# Patient Record
Sex: Female | Born: 1990 | Race: White | Hispanic: No | Marital: Single | Smoking: Current every day smoker
Health system: Southern US, Community
[De-identification: ages and names within clinical notes are randomized; demographics above are authoritative.]

## PROBLEM LIST (undated history)

## (undated) HISTORY — PX: ROTATOR CUFF REPAIR: SHX139

## (undated) HISTORY — PX: ELBOW FRACTURE SURGERY: SHX616

---
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
## 2011-03-02 NOTE — Op Note (Signed)
Julie Landry, Julie Landry              ACCOUNT NO.:  [id number]   MEDICAL RECORD NO.:  [id number]          PATIENT TYPE:  AMB   LOCATION:  DSC                          FACILITY:  [facility]   PHYSICIAN:  Loreta Ave, M.D. DATE OF BIRTH:  Dec 16, 1990   DATE OF PROCEDURE:  05/23/2008  DATE OF DISCHARGE:                               OPERATIVE REPORT   PREOPERATIVE DIAGNOSES:  1. Traumatic dislocation right shoulder with Bankart lesion and Hill-      Sachs lesion.  2. 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   POSTOPERATIVE DIAGNOSES:  1. Traumatic dislocation right shoulder with Bankart lesion and Hill-      Sachs lesion.  2. 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  3. Global laxity, but very pronounced anterior posttraumatic      instability.  4. Anterior labrum tear.   PROCEDURES:  Right shoulder exam under anesthesia, arthroscopy, and  debridement of labrum.  Arthroscopic-assisted Bankart reconstruction  utilizing fiber length suture x3, PushLock anchor x3 for anterior  reconstruction.   SURGEON:  Loreta Ave, MD   ASSISTANT:  Eulas Post, MD and Genene Churn. Barry Dienes, [place], both present  throughout the entire case and necessary for timely completion of  procedure.   ANESTHESIA:  General.   BLOOD LOSS:  Minimal.   SPECIMENS:  None.   CULTURES:  None.   COMPLICATIONS:  None.   DRESSING:  Soft compressive with shoulder immobilizer.   PROCEDURE:  The patient was brought to the operating room, and after  adequate anesthesia had been obtained, both shoulders were examined.  She has increased mobility, posterior subluxation, inferior shoulder  sign, both shoulders which was symmetric.  On the left, not much  anterior instability, on the right, significant anterior instability  after recent traumatic event.  Placed in a beach-chair position on a  shoulder positioner and prepped and draped in usual sterile fashion.  On  the  right, I used a posterior and anterior portal.  The arthroscope was  introduced.  Shaver introduced.  Shoulder distended and inspected.  She  had a new Bankart lesion from 12 down to 6 o'clock.  She had had a  previous capsular shift which appeared to be intact with very reasonable  capsular tissue.  Complex tearing of the anterior labrum debrided.  Front of the glenoid was then roughened.  The capsular labral interface  was then mobilized, so I could not only repair the Bankart but also  refluff the inferior capsule to give me a little bit more stability.  No  new interval tear.  Biceps tendon, biceps anchor, and rotator cuff  intact.  Articular cartilage looked good, but there was a small Hill-  Sachs lesion posteriorly.  Anterior portal was opened for cannula.  I  also used an anterior superior portal.  Utilizing the Lasso device, the  sutures were placed in the capsular labral interface at the 2, 4, and 7  o'clock positions.  I used the middle suture to pull a capsule up  through the superior portal so that the inferior suture was placed well  inferior getting a very generous nice bite of suture.  Once the sutures  were placed, it was anchored down the glenoid at the 1, 3, and 5 o'clock  positions through drill holes utilizing PushLock anchor.  At completion,  nice firm repair of the Bankart lesion with a reefing of the capsule  superiorly.  Marked improvement of stability.  Reinspected  arthroscopically.  No other findings appreciated.  Instruments and fluid  removed.  Portals were all closed with nylon.  Sterile compressive  dressing applied.  Shoulder immobilizer applied.  Anesthesia reversed.  Brought to the recovery room.  Tolerated surgery well.  No  complications.      Loreta Ave, M.D.  Electronically Signed     DFM/MEDQ  D:  05/23/2008  T:  05/24/2008  Job:  [id number]

## 2011-03-05 NOTE — Op Note (Signed)
NAMELAQUESHA, HOLCOMB              ACCOUNT NO.:  [id number]   MEDICAL RECORD NO.:  [id number]          PATIENT TYPE:  AMB   LOCATION:  DSC                          FACILITY:  [facility]   PHYSICIAN:  Loreta Ave, M.D. DATE OF BIRTH:  [birth year]   DATE OF PROCEDURE:  01/06/2005  DATE OF DISCHARGE:                                 OPERATIVE REPORT   PREOPERATIVE DIAGNOSES:  Post traumatic instability with recurrent anterior  dislocation, right shoulder.  Underlying global ligamentous laxity both  shoulders.   POSTOPERATIVE DIAGNOSES:  Post traumatic instability with recurrent anterior  dislocation, right shoulder.  Underlying global ligamentous laxity both  shoulders with anterior labrum tear, Hill-Sachs lesion but no Bankart  lesion.   OPERATION PERFORMED:  Right shoulder examination under anesthesia,  arthroscopy, debridement of labrum and Hill-Sachs lesion.  Open  anteroinferior capsular shift procedure for stabilization.   SURGEON:  Loreta Ave, M.D.   ASSISTANT:  Genene Churn. Denton Meek.   ANESTHESIA:  General.   ESTIMATED BLOOD LOSS:  Minimal.   SPECIMENS:  None.   CULTURES:  None.   COMPLICATIONS:  None.   DRESSING:  Soft compressive with shoulder immobilizer.   DESCRIPTION OF PROCEDURE:  The patient was brought to the operating room and  after adequate anesthesia had been obtained, both shoulders examined.  She  has ligamentous laxity with hyperextension, both elbows, positive sulcus  sign both shoulders.  The right, however, has more anterior instability with  increased external rotation but there is global ligamentous laxity, again  more accentuated with traumatic injury on the right.  Placed in beach chair  position on a shoulder positioner and prepped and draped in the usual  sterile fashion.  Anterior and posterior portals created.  Shoulder entered  with blunt obturator, distended and inspected.  The anterior labral tear  debrided.  Capsular stretch injury  without a true Bankart lesion as the  capsular ligamentous structures still had good attachment all the way along  the front of the glenoid.  A little hiatus behind the labrum at the top was  a normal variant.  Biceps tendon, capsular ligamentous structures other than  being relaxed, were still intact.  Rotator cuff intact.  About a 1 cm  diameter Hill-Sachs lesion on the back of the humeral head debrided and  chondral loose bodies removed.  Instruments and fluid removed after  debridement of the labrum with a shaver.  Anterior incision from coracoid to  the anterior axillary pole.  Deltopectoral interval opened and the mini  Charnley retractor put in place, retracting the deltoid, the pectoralis as  well as conjoined tendon.  Front of the capsule exposed.  An interval tear  along the top of the subscapularis and front of the supraspinatus which  accentuated laxity.  Subscapularis was taken down anatomically, leaving some  of the tendon on the front of the capsule to thicken it.  Retracted.  The  capsule was then cut at the 3 o'clock position from the glenoid to the  humerus.  Cut from 12 o'clock to 6 o'clock position on the humerus.  Care  taken to protect the axillary nerve inferiorly.  Shoulder was inspected.  Wound irrigated.  Again confirmed no Bankart lesion.  I then reefed the  inferior leaflet up to the 12 o'clock and the superior leaflet down to 6  o'clock overlapping the two and then firmly reattaching them to one another  as well as to the lateral margin with numerous interrupted #2 Ethibond  sutures.  Stability assessed along with motion and felt to be much improved.  I then repaired the subscapularis anatomically and also along the top edge  closed the interval lesion between the front of the supraspinatus and the  top of the subscapularis to further snug up the shoulder.  At completion I  had good overhead motion, still had fairly complex external rotation past 60  degrees  without undue tension to the repair.  I left it with a normal laxity  of the shoulder, not over tightening this, but still markedly improved in  stability.  Wound irrigated.  Deltopectoral interval allowed to close.  Skin  and subcutaneous tissue closed with Vicryl.  Margins of the wound injected  with Marcaine.  Portals closed with nylon, injected with Marcaine as well.  Sterile compressive dressing and shoulder immobilizer applied.  Anesthesia  reversed.  Brought to recovery room.  Tolerated surgery well.  No  complications.      DFM/MEDQ  D:  01/06/2005  T:  01/06/2005  Job:  [id number]

## 2013-07-25 DIAGNOSIS — R87612 Low grade squamous intraepithelial lesion on cytologic smear of cervix (LGSIL): Secondary | ICD-10-CM | POA: Insufficient documentation

## 2019-07-28 ENCOUNTER — Emergency Department (INDEPENDENT_AMBULATORY_CARE_PROVIDER_SITE_OTHER): Payer: 59

## 2019-07-28 ENCOUNTER — Emergency Department
Admission: EM | Admit: 2019-07-28 | Discharge: 2019-07-28 | Disposition: A | Discharge status: Home / Self Care | Payer: 59 | Source: Home / Self Care | Attending: Emergency Medicine | Admitting: Emergency Medicine

## 2019-07-28 ENCOUNTER — Other Ambulatory Visit: Payer: Self-pay

## 2019-07-28 ENCOUNTER — Encounter: Payer: Self-pay | Admitting: *Deleted

## 2019-07-28 DIAGNOSIS — R079 Chest pain, unspecified: Secondary | ICD-10-CM

## 2019-07-28 DIAGNOSIS — R635 Abnormal weight gain: Secondary | ICD-10-CM | POA: Diagnosis not present

## 2019-07-28 DIAGNOSIS — R0789 Other chest pain: Secondary | ICD-10-CM

## 2019-07-28 DIAGNOSIS — R600 Localized edema: Secondary | ICD-10-CM

## 2019-07-28 LAB — POCT URINE PREGNANCY: Preg Test, Ur: NEGATIVE

## 2019-07-28 NOTE — ED Triage Notes (Signed)
Patient c/o 40lb weight gain in 6 months and swelling in both lower legs in the past 4 months. She started a birth control 1 month ago. Yesterday she developed chest tightness and felt slightly confused with a visual disturbance. Today she feels well but the chest tightness is intermittent. She has an appointment with a new PCP in 2 days, Rosemary Holms.

## 2019-07-28 NOTE — ED Provider Notes (Addendum)
Julie Landry CARE    CSN: [id number] Arrival date & time: 07/28/19  1329      History   Chief Complaint Chief Complaint  Patient presents with  . Leg Swelling  . Weight Gain  . Chest Pain    HPI Julie Landry is a 28 y.o. female.  New patient.  Presents to [facility] urgent care Saturday, 07/28/2019. She has an appointment with a new PCP Cecile Sheerer in 2 days, but patient was concerned about ruling out an acute heart or lung or blood clot event in the meantime. HPI Patient c/o 40lb weight gain in 6 months and swelling in both lower legs in the past 4 months. She started a birth control 1 month ago.-Had used an IUD before that.  She states she thinks she had a thyroid blood test done at her GYN within the past year that she thinks was normal.  Yesterday she developed anterior and right anterior chest tightness at rest, and felt slightly confused with a vague visual disturbance.  The visual disturbance lasted a couple seconds then resolved.  She is not sure if she was anxious at the time.  The chest discomfort lasted minutes or longer and then resolved on its own yesterday.  Today she feels well but again had the chest tightness this morning at rest, was pleuritic resolved and recurred a couple times, and still has some mild right anterior chest discomfort.  No radiation.  Not affected by position, but can be affected by movement somewhat.  Denies shoulder or elbow or neck pain associated with this, except she feels some mild bilateral, lateral neck tightness at times.  Other associated symptoms: No shortness of breath.  No syncope.  No focal weakness or numbness.  Denies nausea or vomiting or diarrhea.  Denies fever or chills or new rash.  She admits she is under a great deal of stress this past year.  Both parents have new major medical diagnoses.  She has changed jobs twice in the past year. Denies frank depressive symptoms or suicidal or homicidal ideation.  Patient's last  menstrual period was 06/28/2019.    History reviewed. No pertinent past medical history.  There are no active problems to display for this patient.   Past Surgical History:  Procedure Laterality Date  . ELBOW FRACTURE SURGERY    . ROTATOR CUFF REPAIR      OB History   No obstetric history on file.      Home Medications    Prior to Admission medications   Medication Sig Start Date End Date Taking? Authorizing Provider  levonorgestrel-ethinyl estradiol (SEASONALE) 0.15-0.03 MG tablet Take 1 tablet by mouth daily.   Yes [provider]    Family History Family History  Problem Relation Age of Onset  . Heart attack Mother   . Diabetes Mother   . Parkinson's disease Father   . Hyperlipidemia Father     Social History Social History   Tobacco Use  . Smoking status: Current Every Day Smoker    Packs/day: 1.00    Years: 8.00    Pack years: 8.00    Types: Cigarettes  . Smokeless tobacco: Never Used  Substance Use Topics  . Alcohol use: Yes  . Drug use: Never     Allergies   Sulfur   Review of Systems Review of Systems  All other systems reviewed and are negative.  Pertinent items noted in HPI and remainder of comprehensive ROS otherwise negative.   Physical Exam Triage Vital  Signs ED Triage Vitals [07/28/19 1451]  Enc Vitals Group     BP 117/74     Pulse Rate 67     Resp 20     Temp 98.7 F (37.1 C)     Temp Source Oral     SpO2 99 %     Weight 181 lb (82.1 kg)     Height 5\' 6"  (1.676 m)     Head Circumference      Peak Flow      Pain Score 0     Pain Loc      Pain Edu?      Excl. in GC?    No data found.  Updated Vital Signs BP 117/74 (BP Location: Right Arm)   Pulse 67   Temp 98.7 F (37.1 C) (Oral)   Resp 20   Ht 5\' 6"  (1.676 m)   Wt 82.1 kg   LMP 06/28/2019   SpO2 99%   BMI 29.21 kg/m   Visual Acuity Right Eye Distance:   Left Eye Distance:   Bilateral Distance:    Right Eye Near:  Grossly intact Left Eye  Near:   Grossly intact Bilateral Near:     Physical Exam Vitals signs and nursing note reviewed.  Constitutional:      General: She is not in acute distress.    Appearance: She is well-developed. She is not ill-appearing or diaphoretic.  HENT:     Head: Normocephalic and atraumatic.  Eyes:     Extraocular Movements: Extraocular movements intact.     Pupils: Pupils are equal, round, and reactive to light.  Neck:     Musculoskeletal: Normal range of motion and neck supple.     Thyroid: No thyromegaly.     Vascular: No hepatojugular reflux or JVD.     Trachea: No tracheal deviation.     Comments: Mild tenderness bilaterally of right and left cervical muscles but no posterior neck pain.  No C-spine tenderness or deformity. Cardiovascular:     Rate and Rhythm: Normal rate and regular rhythm.     Pulses:          Carotid pulses are 2+ on the right side and 2+ on the left side.    Heart sounds: No murmur. No friction rub. No gallop.   Pulmonary:     Effort: Pulmonary effort is normal.     Breath sounds: Normal breath sounds. No decreased breath sounds, wheezing, rhonchi or rales.  Chest:     Comments: Mild tenderness right costochondral junction. Abdominal:     General: Bowel sounds are normal. There is no abdominal bruit.     Palpations: Abdomen is soft. There is no hepatomegaly, splenomegaly or mass.     Tenderness: There is no abdominal tenderness. There is no guarding or rebound.  Musculoskeletal: Normal range of motion.     Right lower leg: She exhibits no tenderness. No edema.     Left lower leg: She exhibits no tenderness. No edema.  Lymphadenopathy:     Cervical: No cervical adenopathy.  Skin:    General: Skin is warm and dry.     Capillary Refill: Capillary refill takes less than 2 seconds.     Findings: No ecchymosis or rash.  Neurological:     General: No focal deficit present.     Mental Status: She is alert and oriented to person, place, and time.     Cranial  Nerves: No cranial nerve deficit.     Motor: No  weakness.  Psychiatric:        Mood and Affect: Mood normal.      UC Treatments / Results  3:30 PM, chest x-ray, EKG and urine pregnancy test ordered  Labs (all labs ordered are listed, but only abnormal results are displayed) Labs Reviewed  POCT URINE PREGNANCY  Urine pregnancy test negative.  Reviewed with patient.  EKG Normal sinus rhythm.  No acute abnormalities.  No STEMI.  No ST or T wave abnormalities noted.  Radiology Dg Chest 2 View  Result Date: 07/28/2019 CLINICAL DATA:  Chest tightness.  Leg edema.  Weight gain. EXAM: CHEST - 2 VIEW COMPARISON:  None. FINDINGS: Midline trachea. Normal heart size and mediastinal contours. No pleural effusion or pneumothorax. Suspicion of scarring at the medial right lung base. IMPRESSION: No acute cardiopulmonary disease. Electronically Signed   By: Abigail Miyamoto M.D.   On: 07/28/2019 15:55   Reviewed with patient EKG and chest x-ray within normal limits, no acute abnormalities  Medications Ordered in UC Medications - No data to display  Initial Impression / Assessment and Plan / UC Course  I have reviewed the triage vital signs and the nursing notes.  Pertinent labs & imaging results that were available during my care of the patient were reviewed by me and considered in my medical decision making (see chart for details).     Reviewed with patient above normal labs.  Explained there is no sign of acute cardiorespiratory abnormality and clinically no sign of DVT or suspicion of blood clot.  Pulse ox 99% room air.  Pulse 65, regular. She does not have pitting edema of her legs on exam so no evidence of venous insufficiency or fluid overload. I explained that she needs full evaluation and metabolic work-up by her PCP blood tests and other appropriate tests and refer if needed.-She voiced understanding and she will keep with PCP in 2 days. I explained the pain could be musculoskeletal.   There is been element of costochondritis that can be treated with ibuprofen and we discussed that. Further explained its possible that symptoms related to stress, but that would be a diagnosis of exclusion. Also advised to follow-up with GYN for ongoing management of contraception, and I explained it is possible this could be a GYN metabolic problem causing some of her symptoms, but I defer to PCP regarding this work-up. I also suggested she discuss with PCP ways of decreasing her cardiovascular risk, including option of low-dose aspirin as prophylaxis., given that she's on BCPs and smokes cigarettes.   I urged her to quit smoking and explained risks of not doing so.  She stated she was very satisfied with evaluation today and agrees with above plans. Precautions discussed. Red flags discussed. Questions invited and answered.-I offered AVS, she declined AVS. Patient voiced understanding and agreement.  Final Clinical Impressions(s) / UC Diagnoses   Final diagnoses:  Weight gain  Bilateral leg edema  Chest pain  Other chest pain     Discharge Instructions     Declined AVS    ED Prescriptions    None     PDMP not reviewed this encounter.   Jacqulyn Cane, MD 07/29/19 1459    Jacqulyn Cane, MD 07/29/19 [phone]

## 2019-07-28 NOTE — Discharge Instructions (Signed)
Declined AVS

## 2019-10-04 ENCOUNTER — Encounter: Payer: Self-pay | Admitting: Osteopathic Medicine

## 2019-10-04 ENCOUNTER — Ambulatory Visit (INDEPENDENT_AMBULATORY_CARE_PROVIDER_SITE_OTHER): Payer: Managed Care, Other (non HMO) | Admitting: Osteopathic Medicine

## 2019-10-04 ENCOUNTER — Other Ambulatory Visit: Payer: Self-pay

## 2019-10-04 VITALS — BP 124/80 | HR 77 | Temp 98.6°F | Ht 67.0 in | Wt 181.1 lb

## 2019-10-04 DIAGNOSIS — Z23 Encounter for immunization: Secondary | ICD-10-CM | POA: Diagnosis not present

## 2019-10-04 DIAGNOSIS — R0602 Shortness of breath: Secondary | ICD-10-CM

## 2019-10-04 DIAGNOSIS — R635 Abnormal weight gain: Secondary | ICD-10-CM

## 2019-10-04 DIAGNOSIS — R5382 Chronic fatigue, unspecified: Secondary | ICD-10-CM

## 2019-10-04 DIAGNOSIS — R0789 Other chest pain: Secondary | ICD-10-CM

## 2019-10-04 NOTE — Progress Notes (Signed)
HPI: Julie Landry is a 28 y.o. female who  has no past medical history on file.  she presents to [facility] today, 10/04/19,  for chief complaint of: New patient to establish care   Patient here to establish care.  She works as a Biochemist, clinical for [employer].   Reports over the past several months, noticing more fatigue/less motivation, occasional feeling like unable to get a deep breath/chest pressure, feeling more short of breath.  No chest pain on exertion.  No cough.  Used to be quite athletic, activity level has changed drastically since she has not been able to go to the gym through the Covid pandemic.  No history of officially diagnosed asthma or other respiratory illness though patient was on inhalers for short time in college when she was participating in athletics.  Patient is on OCP, smoking about 1 pack/day for 8 years, has also used e-cigarette.  Occasional alcohol use about 2 drinks per week.  Pap through [facility] in [place].  Consistent condom use, last STI screening reportedly done in September 2020.  LMP 09/21/2019   Mom has history of diabetes and heart attack -heart attack earlier this year, in her 62s.  Dad hashistory of depression and Parkinson's.      Past medical, surgical, social and family history reviewed:  There are no problems to display for this patient.   Past Surgical History:  Procedure Laterality Date  . ELBOW FRACTURE SURGERY    . ROTATOR CUFF REPAIR      Social History   Tobacco Use  . Smoking status: Current Every Day Smoker    Packs/day: 1.00    Years: 8.00    Pack years: 8.00    Types: Cigarettes  . Smokeless tobacco: Never Used  Substance Use Topics  . Alcohol use: Yes    Family History  Problem Relation Age of Onset  . Heart attack Mother   . Diabetes Mother   . Parkinson's disease Father   . Hyperlipidemia Father   . Depression Father      Current medication list and  allergy/intolerance information reviewed:    Current Outpatient Medications  Medication Sig Dispense Refill  . levonorgestrel-ethinyl estradiol (SEASONALE) 0.15-0.03 MG tablet Take 1 tablet by mouth daily.    . Levonorgestrel-Ethinyl Estradiol (SEASONIQUE) 0.15-0.03 &0.01 MG tablet Seasonique 0.15 mg-30 mcg (84)/10 mcg(7) tablets,3 month dose pack  Take 1 tablet every day by oral route.     No current facility-administered medications for this visit.    Allergies  Allergen Reactions  . Sulfa Antibiotics   . Sulfur Nausea And Vomiting      Review of Systems:  Constitutional:  No  fever, no chills, No recent illness, No unintentional weight changes. +significant fatigue.   HEENT: No  headache, no vision change, no hearing change, No sore throat, No  sinus pressure  Cardiac: No  chest pain, +pressure, No palpitations, No  Orthopnea  Respiratory:  +shortness of breath. No  Cough  Gastrointestinal: No  abdominal pain, No  nausea, No  vomiting,  No  blood in stool, No  diarrhea, No  constipation   Musculoskeletal: No new myalgia/arthralgia  Skin: No  Rash, No other wounds/concerning lesions  Genitourinary: No  incontinence, No  abnormal genital bleeding, No abnormal genital discharge  Hem/Onc: No  easy bruising/bleeding, No  abnormal lymph node  Endocrine: No cold intolerance,  No heat intolerance. No polyuria/polydipsia/polyphagia   Neurologic: No  weakness, No  dizziness,  No  slurred speech/focal weakness/facial droop  Psychiatric: No  concerns with depression, No  concerns with anxiety, No sleep problems, No mood problems  Exam:  BP 124/80 (BP Location: Left Arm, Patient Position: Sitting, Cuff Size: Normal)   Pulse 77   Temp 98.6 F (37 C) (Oral)   Ht 5\' 7"  (1.702 m)   Wt 181 lb 1.3 oz (82.1 kg)   BMI 28.36 kg/m   Constitutional: VS see above. General Appearance: alert, well-developed, well-nourished, NAD  Neck: No masses, trachea midline. No thyroid enlargement.  No tenderness/mass appreciated. No lymphadenopathy  Respiratory: Normal respiratory effort. no wheeze, no rhonchi, no rales  Cardiovascular: S1/S2 normal, no murmur, no rub/gallop auscultated. RRR. No lower extremity edema.  Gastrointestinal: Nontender, no masses. No hepatomegaly, no splenomegaly. No hernia appreciated. Bowel sounds normal. Rectal exam deferred.   Musculoskeletal: Gait normal. No clubbing/cyanosis of digits.   Neurological: Normal balance/coordination. No tremor. No cranial nerve deficit on limited exam. Motor and sensation intact and symmetric. Cerebellar reflexes intact.   Skin: warm, dry, intact. No rash/ulcer. No concerning nevi or subq nodules on limited exam.    Psychiatric: Normal judgment/insight. Normal mood and affect. Oriented x3.         ASSESSMENT/PLAN: The primary encounter diagnosis was Chronic fatigue. Diagnoses of Need for Tdap vaccination, Weight gain, Short of breath on exertion, and Chest pressure were also pertinent to this visit.  Seems to be most likely related to deconditioning, no cardiac/respiratory red flags though may benefit from PFTs/exercise tolerance test.  Could consider restart/trial inhaler, patient declines this for now and I think that is reasonable  Patient would like to proceed with exercise tolerance test to rule out cardiac issues.  We discussed gradual increase in physical activity to improve cardiovascular fitness/physical endurance.  She is not concerned about mental health as underlying cause of symptoms.  Reports normal lab work-up at OB/GYN few months ago, only other thing she can think of was Skyla removed and started on OCP but the symptoms as noted above predate this.  We will get GYN records  Orders Placed This Encounter  Procedures  . Tdap vaccine greater than or equal to 7yo IM  . Exercise Tolerance Test          Visit summary with medication list and pertinent instructions was printed for patient to  review. All questions at time of visit were answered - patient instructed to contact office with any additional concerns or updates. ER/RTC precautions were reviewed with the patient.     Please note: voice recognition software was used to produce this document, and typos may escape review. Please contact Dr. for any needed clarifications.     Follow-up plan: Return for RECHECK PENDING STRESS TEST RESULTS / IF WORSE OR CHANGE.

## 2020-01-29 ENCOUNTER — Ambulatory Visit (INDEPENDENT_AMBULATORY_CARE_PROVIDER_SITE_OTHER): Payer: 59 | Admitting: Medical-Surgical

## 2020-01-29 ENCOUNTER — Encounter: Payer: Self-pay | Admitting: Medical-Surgical

## 2020-01-29 ENCOUNTER — Other Ambulatory Visit: Payer: Self-pay

## 2020-01-29 VITALS — BP 107/71 | HR 71 | Temp 98.2°F | Ht 67.0 in | Wt 169.8 lb

## 2020-01-29 DIAGNOSIS — R319 Hematuria, unspecified: Secondary | ICD-10-CM

## 2020-01-29 DIAGNOSIS — R300 Dysuria: Secondary | ICD-10-CM | POA: Diagnosis not present

## 2020-01-29 LAB — POCT URINALYSIS DIP (CLINITEK)
Bilirubin, UA: NEGATIVE
Glucose, UA: NEGATIVE mg/dL
Ketones, POC UA: NEGATIVE mg/dL
Leukocytes, UA: NEGATIVE
Nitrite, UA: NEGATIVE
POC PROTEIN,UA: NEGATIVE
Spec Grav, UA: 1.025 (ref 1.010–1.025)
Urobilinogen, UA: 0.2 E.U./dL
pH, UA: 6 (ref 5.0–8.0)

## 2020-01-29 MED ORDER — AMOXICILLIN-POT CLAVULANATE 875-125 MG PO TABS: 1.0000 | ORAL_TABLET | Freq: Two times a day (BID) | ORAL | 0 refills | Status: AC

## 2020-01-29 NOTE — Progress Notes (Signed)
Subjective:    CC: Dysuria  HPI: Pleasant 29 year old female presenting today with reports of dysuria, frequency, urgency, incomplete voiding, and low back pain x5 days.  Has a burning/itching feeling immediately after voiding that resolves within approximately 60 seconds.  Reports a history of frequent urinary tract infections but admits that she has not had her urine checked each time.  Currently sexually active with 1 female partner in a monogamous relationship, using condoms.  No vaginal discharge, odor, or irregular bleeding.  Denies fever, chills.  Has been taking Azo with minimal relief of symptoms.  Found an old prescription of amoxicillin that was left over and took 1 yesterday, reports this helped her symptoms.  I reviewed the past medical history, family history, social history, surgical history, and allergies today and no changes were needed.  Please see the problem list section below in epic for further details.  Past Medical History: History reviewed. No pertinent past medical history. Past Surgical History: Past Surgical History:  Procedure Laterality Date  . ELBOW FRACTURE SURGERY    . ROTATOR CUFF REPAIR     Social History: Social History   Socioeconomic History  . Marital status: Single    Spouse name: Not on file  . Number of children: Not on file  . Years of education: Not on file  . Highest education level: Not on file  Occupational History  . Occupation: Press photographer Rep    Employer: [employer]  Tobacco Use  . Smoking status: Current Every Day Smoker    Packs/day: 1.00    Years: 8.00    Pack years: 8.00    Types: Cigarettes  . Smokeless tobacco: Never Used  Substance and Sexual Activity  . Alcohol use: Yes  . Drug use: Never  . Sexual activity: Yes    Partners: Male    Birth control/protection: Condom  Other Topics Concern  . Not on file  Social History Narrative  . Not on file   Social Determinants of Health   Financial Resource Strain:   .  Difficulty of Paying Living Expenses:   Food Insecurity:   . Worried About Charity fundraiser in the Last Year:   . Arboriculturist in the Last Year:   Transportation Needs:   . Film/video editor (Medical):   Marland Kitchen Lack of Transportation (Non-Medical):   Physical Activity:   . Days of Exercise per Week:   . Minutes of Exercise per Session:   Stress:   . Feeling of Stress :   Social Connections:   . Frequency of Communication with Friends and Family:   . Frequency of Social Gatherings with Friends and Family:   . Attends Religious Services:   . Active Member of Clubs or Organizations:   . Attends Archivist Meetings:   Marland Kitchen Marital Status:    Family History: Family History  Problem Relation Age of Onset  . Heart attack Mother   . Diabetes Mother   . Parkinson's disease Father   . Hyperlipidemia Father   . Depression Father    Allergies: Allergies  Allergen Reactions  . Sulfa Antibiotics   . Sulfur Nausea And Vomiting   Medications: See med rec.  Review of Systems: No fevers, chills, night sweats, weight loss, chest pain, or shortness of breath.   Objective:    General: Well Developed, well nourished, and in no acute distress.  Neuro: Alert and oriented x3.  HEENT: Normocephalic, atraumatic.  Skin: Warm and dry. Cardiac: Regular rate and  rhythm, no murmurs rubs or gallops, no lower extremity edema.  Respiratory: Clear to auscultation bilaterally. Not using accessory muscles, speaking in full sentences.  Impression and Recommendations:    Dysuria POCT UA negative for nitrites and leukocytes, positive for blood.  Sending for culture, gonorrhea and chlamydia.  Given good response to amoxicillin, treating empirically with Augmentin twice daily x5 days.  May continue to use Azo as needed.  Push p.o. fluids.  Reviewed UTI prevention measures.   Return if symptoms worsen or fail to improve.  ___________________________________________ Thayer Ohm, DNP, APRN,  FNP-BC Primary Care and Sports Medicine [facility]

## 2020-01-30 LAB — C. TRACHOMATIS/N. GONORRHOEAE RNA
C. trachomatis RNA, TMA: NOT DETECTED
N. gonorrhoeae RNA, TMA: NOT DETECTED

## 2020-01-30 LAB — URINE CULTURE
MICRO NUMBER:: 10358389
SPECIMEN QUALITY:: ADEQUATE

## 2020-02-04 ENCOUNTER — Encounter: Payer: Self-pay | Admitting: Osteopathic Medicine

## 2020-02-04 DIAGNOSIS — B379 Candidiasis, unspecified: Secondary | ICD-10-CM

## 2020-02-04 MED ORDER — FLUCONAZOLE 150 MG PO TABS: 150.0000 mg | ORAL_TABLET | Freq: Once | ORAL | 1 refills | Status: AC

## 2020-10-03 ENCOUNTER — Ambulatory Visit (INDEPENDENT_AMBULATORY_CARE_PROVIDER_SITE_OTHER): Payer: 59

## 2020-10-03 ENCOUNTER — Other Ambulatory Visit: Payer: Self-pay | Admitting: Chiropractic Medicine

## 2020-10-03 DIAGNOSIS — M545 Low back pain, unspecified: Secondary | ICD-10-CM | POA: Diagnosis not present

## 2020-10-13 ENCOUNTER — Ambulatory Visit (INDEPENDENT_AMBULATORY_CARE_PROVIDER_SITE_OTHER): Payer: 59 | Admitting: Osteopathic Medicine

## 2020-10-13 ENCOUNTER — Encounter: Payer: Self-pay | Admitting: Osteopathic Medicine

## 2020-10-13 VITALS — BP 116/65 | HR 75 | Temp 98.4°F

## 2020-10-13 DIAGNOSIS — M533 Sacrococcygeal disorders, not elsewhere classified: Secondary | ICD-10-CM

## 2020-10-13 DIAGNOSIS — M545 Low back pain, unspecified: Secondary | ICD-10-CM | POA: Diagnosis not present

## 2020-10-13 DIAGNOSIS — M4124 Other idiopathic scoliosis, thoracic region: Secondary | ICD-10-CM | POA: Diagnosis not present

## 2020-10-13 MED ORDER — MELOXICAM 15 MG PO TABS: 15.0000 mg | ORAL_TABLET | Freq: Every day | ORAL | 0 refills | Status: DC

## 2020-10-13 MED ORDER — PREDNISONE 20 MG PO TABS: 20.0000 mg | ORAL_TABLET | Freq: Two times a day (BID) | ORAL | 0 refills | Status: DC

## 2020-10-13 NOTE — Patient Instructions (Signed)
Plan: See printed instructions for home exercises / stretches Other prescriptions:  Steroids (predisone) burst for 5 days  Anti-inflammatory (meloxicam) take daily for 5-7 days then can keep on had for use as-needed after that Refer for formal physical therapy Please contact our office for sports medicine appointment with Dr T if PT not helping

## 2020-10-13 NOTE — Progress Notes (Signed)
Julie Landry is a 29 y.o. female who presents to  [facility] at [facility]  today, 10/13/20, seeking care for the following:  . Back pain - ongoing about a month. Was seeing chiropractor who told her it was L5 herniated disc. Multiple chiro treatments have not helped. No sciatica symptoms. On exam, painful tender point at upper R SI joint / lateral to L5 on L.    DG Lumbar Spine 2-3 Views  Result Date: 10/03/2020 CLINICAL DATA:  Low back pain EXAM: LUMBAR SPINE - 2-3 VIEW COMPARISON:  None. FINDINGS: Mild dextroscoliosis of the thoracolumbar spine. Sagittal alignment within normal limits. Vertebral body heights and disc spaces are relatively maintained. IMPRESSION: Mild dextroscoliosis of the thoracolumbar spine. No acute osseous abnormality Electronically Signed   By: Jasmine Pang M.D.   On: 10/03/2020 21:18        ASSESSMENT & PLAN with other pertinent findings:  The primary encounter diagnosis was Acute midline low back pain without sciatica. Diagnoses of Other idiopathic scoliosis, thoracic region and Sacroiliac pain were also pertinent to this visit.   OMT attempted w/ HVLA not successful but MFR and sacral maneuvers helped somewhat. Referral to PT. I don't see utility in repeating imaging at this point but would have low threshold for referral to sports med consult re: would SI injection be helpful vs MRI for further interventional planning?   No results found for this or any previous visit (from the past 24 hour(s)).     Patient Instructions  Plan: See printed instructions for home exercises / stretches Other prescriptions:  Steroids (predisone) burst for 5 days  Anti-inflammatory (meloxicam) take daily for 5-7 days then can keep on had for use as-needed after that Refer for formal physical therapy Please contact our office for sports medicine appointment with Dr T if PT not helping      Orders Placed This Encounter   Procedures  . Ambulatory referral to Physical Therapy    Meds ordered this encounter  Medications  . meloxicam (MOBIC) 15 MG tablet    Sig: Take 1 tablet (15 mg total) by mouth daily.    Dispense:  30 tablet    Refill:  0  . predniSONE (DELTASONE) 20 MG tablet    Sig: Take 1 tablet (20 mg total) by mouth 2 (two) times daily with a meal.    Dispense:  10 tablet    Refill:  0       Follow-up instructions: Return for VISIT WITH SPORTS MEDICINE FOR ORTHOPEDIC ISSUE IF NEEDED .                                         BP 116/65   Pulse 75   Temp 98.4 F (36.9 C)   SpO2 98%   Current Meds  Medication Sig  . levonorgestrel-ethinyl estradiol (SEASONALE) 0.15-0.03 MG tablet Take 1 tablet by mouth daily.  . meloxicam (MOBIC) 15 MG tablet Take 1 tablet (15 mg total) by mouth daily.  . predniSONE (DELTASONE) 20 MG tablet Take 1 tablet (20 mg total) by mouth 2 (two) times daily with a meal.    No results found for this or any previous visit (from the past 72 hour(s)).  No results found.     All questions at time of visit were answered - patient instructed to contact office with any additional concerns or  updates.  ER/RTC precautions were reviewed with the patient as applicable.   Please note: voice recognition software was used to produce this document, and typos may escape review. Please contact Dr. Lyn Hollingshead for any needed clarifications.

## 2020-10-15 ENCOUNTER — Encounter: Payer: Self-pay | Admitting: Physical Therapy

## 2020-10-15 ENCOUNTER — Ambulatory Visit (INDEPENDENT_AMBULATORY_CARE_PROVIDER_SITE_OTHER): Payer: 59 | Admitting: Physical Therapy

## 2020-10-15 ENCOUNTER — Other Ambulatory Visit: Payer: Self-pay

## 2020-10-15 DIAGNOSIS — M545 Low back pain, unspecified: Secondary | ICD-10-CM | POA: Diagnosis not present

## 2020-10-15 DIAGNOSIS — M6281 Muscle weakness (generalized): Secondary | ICD-10-CM | POA: Diagnosis not present

## 2020-10-15 NOTE — Therapy (Signed)
[facility] [place] [address] [place], [place], [address] Phone: [phone]   Fax:  [phone]  Physical Therapy Evaluation  Patient Details  Name: Julie Landry MRN: [id number] Date of Birth: October 07, 1991 Referring Provider (PT): natalie alexander   Encounter Date: 10/15/2020   PT End of Session - 10/15/20 0843    Visit Number 1    Number of Visits 6    Date for PT Re-Evaluation 11/26/20    PT Start Time 0800    PT Stop Time 0839    PT Time Calculation (min) 39 min    Activity Tolerance Patient tolerated treatment well    Behavior During Therapy [facility] for tasks assessed/performed           History reviewed. No pertinent past medical history.  Past Surgical History:  Procedure Laterality Date  . ELBOW FRACTURE SURGERY    . ROTATOR CUFF REPAIR      There were no vitals filed for this visit.    Subjective Assessment - 10/15/20 0803    Subjective Pt states that 1 month ago her low back started hurting "really bad". Pain was 8-9/10. Pt saw a chiropractor which she states did not help. Pt saw MD and got meds which help reduce pain. Pain increases with prolonged standing and walking or long periods of sitting. Pain also increases with lifting at work    Limitations Sitting;Standing;Walking    How long can you sit comfortably? 1 hour    How long can you stand comfortably? 2 hours    How long can you walk comfortably? 2 hours    Diagnostic tests x ray reveals dextroscoliosis    Patient Stated Goals decrease pain    Currently in Pain? Yes    Pain Score 1     Pain Location Back    Pain Orientation Lower    Pain Descriptors / Indicators Sore    Pain Type Acute pain    Pain Onset More than a month ago    Pain Frequency Intermittent    Aggravating Factors  prolonged sitting, walking ,standing    Pain Relieving Factors meds    Effect of Pain on Daily Activities pain with work and recreation              [facility] PT  Assessment - 10/15/20 0001      Assessment   Medical Diagnosis acute midline low back pain wihtout sciatica, sacroiliac pain    Referring Provider (PT) natalie alexander    Onset Date/Surgical Date 09/12/20      Observation/Other Assessments   Focus on Therapeutic Outcomes (FOTO)  37% limited      ROM / Strength   AROM / PROM / Strength AROM;Strength      AROM   AROM Assessment Site Lumbar    Lumbar Flexion WFL    Lumbar Extension WFL - pain at end range    Lumbar - Right Side Bend [facility]    Lumbar - Left Side Bend WFL    Lumbar - Right Rotation WFL    Lumbar - Left Rotation [facility]      Strength   Strength Assessment Site Hip    Right/Left Hip Right;Left    Right Hip Flexion 4/5    Right Hip Extension 4+/5    Right Hip ABduction 5/5    Left Hip Flexion 4+/5    Left Hip Extension 4+/5      Palpation   SI assessment  decreased jt mobility, improves with grade  3 jt mobs      Special Tests    Special Tests Lumbar    Lumbar Tests FABER test;Straight Leg Raise      FABER test   findings Negative      Straight Leg Raise   Findings Negative                      Objective measurements completed on examination: See above findings.       [facility] Adult PT Treatment/Exercise - 10/15/20 0001      Exercises   Exercises Lumbar      Lumbar Exercises: Standing   Other Standing Lumbar Exercises pallof press x 10 bilat with green band      Lumbar Exercises: Supine   Ab Set 10 reps;3 seconds   TA activation   AB Set Limitations then TA activation with hip fall out 2 x 10 with palpation of TA      Lumbar Exercises: Sidelying   Clam Both;10 reps                  PT Education - 10/15/20 0850    Education Details PT POC and goals, HEP    Person(s) Educated Patient    Methods Explanation;Demonstration;Handout    Comprehension Returned demonstration;Verbalized understanding               PT Long Term Goals - 10/15/20 0850      PT LONG TERM GOAL #1    Title Pt will be independent in HEP for strengthening    Time 6    Period Weeks    Status New    Target Date 11/26/20      PT LONG TERM GOAL #2   Title Pt will improve FOTO to <= 23% limited to demo improved functional abilities    Time 6    Period Weeks    Status New    Target Date 11/26/20      PT LONG TERM GOAL #3   Title Pt will perform lifiting activities at work with pain <= 2/10    Time 6    Period Weeks    Status New    Target Date 11/26/20                  Plan - 10/15/20 2951    Clinical Impression Statement Pt presents with increased low back pain and decreased SI jt mobility, decreased core strength and will benefit from skilled PT to address deficits and improve functional mobility    Personal Factors and Comorbidities Age    Examination-Activity Limitations Sit;Stairs;Stand;Locomotion Level;Lift    Examination-Participation Restrictions Community Activity;Occupation    Stability/Clinical Decision Making Stable/Uncomplicated    Clinical Decision Making Low    Rehab Potential Good    PT Frequency 1x / week    PT Duration 6 weeks    PT Treatment/Interventions Taping;Dry needling;Manual techniques;Patient/family education;Therapeutic activities;Therapeutic exercise;Neuromuscular re-education;Traction;Moist Heat;Iontophoresis 4mg /ml Dexamethasone;Cryotherapy;Electrical Stimulation    PT Next Visit Plan assess HEP, progress core strength    PT Home Exercise Plan [email or web address]           Patient will benefit from skilled therapeutic intervention in order to improve the following deficits and impairments:  Pain,Decreased strength,Decreased mobility  Visit Diagnosis: Acute midline low back pain without sciatica - Plan: PT plan of care cert/re-cert  Muscle weakness (generalized) - Plan: PT plan of care cert/re-cert     Problem List There are no problems to display for this patient.  Reggy Eye, PT   [name] 10/15/2020, 9:10 AM  [facility] [address] [place], [place], [address] Phone: [phone]   Fax:  [phone]  Name: Julie Landry MRN: [id number] Date of Birth: Oct 24, 1990

## 2020-10-15 NOTE — Patient Instructions (Signed)
Access Code: [email or web address] URL: [email or web address] Date: 10/15/2020 Prepared by: Reggy Eye  Exercises Hooklying Transversus Abdominis Palpation - 1 x daily - 7 x weekly - 2 sets - 10 reps - 3-5 seconds hold Supine Transversus Abdominis Bracing with Double Leg Fallout - 1 x daily - 7 x weekly - 2 sets - 10 reps - 3-5 seconds hold Clamshell - 1 x daily - 7 x weekly - 3 sets - 10 reps Squatting Anti-Rotation Press - 1 x daily - 7 x weekly - 2 sets - 10 reps

## 2020-10-22 ENCOUNTER — Ambulatory Visit (INDEPENDENT_AMBULATORY_CARE_PROVIDER_SITE_OTHER): Payer: 59 | Admitting: Physical Therapy

## 2020-10-22 ENCOUNTER — Other Ambulatory Visit: Payer: Self-pay

## 2020-10-22 DIAGNOSIS — M6281 Muscle weakness (generalized): Secondary | ICD-10-CM

## 2020-10-22 DIAGNOSIS — M545 Low back pain, unspecified: Secondary | ICD-10-CM | POA: Diagnosis not present

## 2020-10-22 NOTE — Therapy (Addendum)
[place] [address] [place], [place], [address] Phone: [phone]   Fax:  [phone]  Physical Therapy Treatment and Discharge  Patient Details  Name: Julie Landry MRN: [id number] Date of Birth: 02-23-1991 Referring Provider (PT): natalie alexander   Encounter Date: 10/22/2020   PT End of Session - 10/22/20 1607    Visit Number 2    Number of Visits 6    Date for PT Re-Evaluation 11/26/20    PT Start Time 3710    PT Stop Time 1635    PT Time Calculation (min) 40 min    Activity Tolerance Patient tolerated treatment well    Behavior During Therapy [facility] for tasks assessed/performed           No past medical history on file.  Past Surgical History:  Procedure Laterality Date  . ELBOW FRACTURE SURGERY    . ROTATOR CUFF REPAIR      There were no vitals filed for this visit.   Subjective Assessment - 10/22/20 1554    Subjective Pt states she is still having back pain as they have been short staffed at work and she has been lifting and doing more    Currently in Pain? Yes    Pain Score 4     Pain Location Back    Pain Orientation Lower    Pain Descriptors / Indicators Sore    Pain Onset More than a month ago                             [facility] Adult PT Treatment/Exercise - 10/22/20 0001      Lumbar Exercises: Supine   Ab Set 10 reps;3 seconds    AB Set Limitations TA with hip fall out x 10    Bridge with [employer] 20 reps      Lumbar Exercises: Sidelying   Clam Right;20 reps    Clam Limitations red TB      Lumbar Exercises: Quadruped   [place] 10 reps      Manual Therapy   Manual Therapy Soft tissue mobilization;Joint mobilization    Manual therapy comments skliled palpation to assess dry needling response    Joint Mobilization SI jt mobs to relieve pain and stiffness grades 2-3    Soft tissue mobilization TPR to glutes and STM glutes and lumbar parapsinals             Trigger Point Dry Needling - 10/22/20 0001    Consent Given? Yes    Education Handout Provided No   verbally educated pt   Muscles Treated Back/Hip Gluteus maximus    Gluteus Maximus Response Palpable increased muscle length                PT Education - 10/22/20 1637    Education Details dry needling    Person(s) Educated Patient    Methods Explanation;Demonstration    Comprehension Verbalized understanding               PT Long Term Goals - 10/15/20 0850      PT LONG TERM GOAL #1   Title Pt will be independent in HEP for strengthening    Time 6    Period Weeks    Status New    Target Date 11/26/20      PT LONG TERM GOAL #2   Title Pt will improve FOTO to <= 23% limited to demo improved functional  abilities    Time 6    Period Weeks    Status New    Target Date 11/26/20      PT LONG TERM GOAL #3   Title Pt will perform lifiting activities at work with pain <= 2/10    Time 6    Period Weeks    Status New    Target Date 11/26/20                 Plan - 10/22/20 1638    Clinical Impression Statement Pt with good response to dry needling with increased mm length. Pt with good coordination during TA contractions    PT Treatment/Interventions Taping;Dry needling;Manual techniques;Patient/family education;Therapeutic activities;Therapeutic exercise;Neuromuscular re-education;Traction;Moist Heat;Iontophoresis 4mg/ml Dexamethasone;Cryotherapy;Electrical Stimulation    PT Next Visit Plan assess response to dry needling. progress core strength    PT Home Exercise Plan [email or web address]    Consulted and Agree with Plan of Care Patient           Patient will benefit from skilled therapeutic intervention in order to improve the following deficits and impairments:     Visit Diagnosis: Acute midline low back pain without sciatica  Muscle weakness (generalized)     Problem List There are no problems to display for this patient. PHYSICAL THERAPY DISCHARGE  SUMMARY  Visits from Start of Care: 2  Current functional level related to goals / functional outcomes: Pt continues with back pain at work   Remaining deficits: See above   Education / Equipment: HEP Plan: Patient agrees to discharge.  Patient goals were not met. Patient is being discharged due to not returning since the last visit.  ?????      DONAWERTH,KAREN, PT  DONAWERTH,KAREN 10/22/2020, 4:39 PM  [facility] [address] [place], [place], [address] Phone: [phone]   Fax:  [phone]  Name: Julie Landry MRN: [id number] Date of Birth: 01/24/1991   

## 2020-10-29 ENCOUNTER — Encounter: Payer: 59 | Admitting: Physical Therapy

## 2020-10-31 ENCOUNTER — Encounter: Payer: Self-pay | Admitting: Osteopathic Medicine

## 2020-11-04 ENCOUNTER — Other Ambulatory Visit: Payer: Self-pay | Admitting: Osteopathic Medicine

## 2020-11-07 ENCOUNTER — Encounter: Payer: 59 | Admitting: Physical Therapy

## 2020-11-26 ENCOUNTER — Telehealth (INDEPENDENT_AMBULATORY_CARE_PROVIDER_SITE_OTHER): Payer: 59 | Admitting: Medical-Surgical

## 2020-11-26 ENCOUNTER — Encounter: Payer: Self-pay | Admitting: Medical-Surgical

## 2020-11-26 DIAGNOSIS — R6889 Other general symptoms and signs: Secondary | ICD-10-CM

## 2020-11-26 MED ORDER — OSELTAMIVIR PHOSPHATE 75 MG PO CAPS: 75.0000 mg | ORAL_CAPSULE | Freq: Two times a day (BID) | ORAL | 0 refills | Status: DC

## 2020-11-26 NOTE — Progress Notes (Signed)
Virtual Visit via Video Note  I connected with Julie Landry on 11/26/20 at 11:10 AM EST by a video enabled telemedicine application and verified that I am speaking with the correct person using two identifiers.   I discussed the limitations of evaluation and management by telemedicine and the availability of in person appointments. The patient expressed understanding and agreed to proceed.  Patient location: home Provider locations: office  Subjective:    CC: upper respiratory symptoms  HPI: Pleasant 30 year old female presenting today via MyChart video visit with reports of Covid-like symptoms.  Notes that she did test + January 11 of this year for Covid and recovered within 5 days without lingering symptoms.  Yesterday around 1:00 in the afternoon, she began to feel bad again.  Positive for body aches, sore throat, headaches, chills, and sinus congestion.  She also has a dry cough that is mild and intermittent.  Denies fevers, chest congestion, and GI symptoms.  She has not been sleeping well due to the severity of her body aches.  Taking DayQuil and NyQuil which is helping somewhat but very temporary.  She has not taken any ibuprofen.  Notes that her boyfriend has similar symptoms.  He Covid tested with a negative result.  Past medical history, Surgical history, Family history not pertinant except as noted below, Social history, Allergies, and medications have been entered into the medical record, reviewed, and corrections made.   Review of Systems: See HPI for pertinent positives and negatives.   Objective:    General: Speaking clearly in complete sentences without any shortness of breath.  Alert and oriented x3.  Normal judgment. No apparent acute distress.  Impression and Recommendations:    1. Flu-like symptoms With a recent infection with COVID, very low suspicion for reinfection.  Treating empirically with Tamiflu twice daily x5 days.  Continue over-the-counter remedies such as  DayQuil or NyQuil.  Recommend adding in 600 mg of ibuprofen every 6 hours to help with body aches.  Discussed recommendation for quarantine and indications to return to work.  Work note provided via Clinical cytogeneticist.  I discussed the assessment and treatment plan with the patient. The patient was provided an opportunity to ask questions and all were answered. The patient agreed with the plan and demonstrated an understanding of the instructions.   The patient was advised to call back or seek an in-person evaluation if the symptoms worsen or if the condition fails to improve as anticipated.  20 minutes of non-face-to-face time was provided during this encounter.  Return if symptoms worsen or fail to improve.  Thayer Ohm, DNP, APRN, FNP-BC [facility] and Sports Medicine

## 2020-11-28 ENCOUNTER — Telehealth: Payer: 59 | Admitting: Medical-Surgical

## 2020-11-28 ENCOUNTER — Encounter: Payer: Self-pay | Admitting: Medical-Surgical

## 2020-11-28 MED ORDER — GUAIFENESIN-CODEINE 100-10 MG/5ML PO SYRP: 5.0000 mL | ORAL_SOLUTION | Freq: Three times a day (TID) | ORAL | 0 refills | Status: DC | PRN

## 2020-12-07 ENCOUNTER — Other Ambulatory Visit: Payer: Self-pay | Admitting: Osteopathic Medicine

## 2021-01-15 ENCOUNTER — Other Ambulatory Visit: Payer: Self-pay | Admitting: Osteopathic Medicine

## 2021-02-05 ENCOUNTER — Other Ambulatory Visit: Payer: Self-pay

## 2021-02-05 ENCOUNTER — Encounter: Payer: Self-pay | Admitting: Family Medicine

## 2021-02-05 ENCOUNTER — Ambulatory Visit: Payer: 59 | Admitting: Family Medicine

## 2021-02-05 VITALS — BP 114/73 | HR 93 | Temp 98.8°F | Resp 15

## 2021-02-05 DIAGNOSIS — N766 Ulceration of vulva: Secondary | ICD-10-CM | POA: Diagnosis not present

## 2021-02-05 DIAGNOSIS — N9089 Other specified noninflammatory disorders of vulva and perineum: Secondary | ICD-10-CM

## 2021-02-05 DIAGNOSIS — Z113 Encounter for screening for infections with a predominantly sexual mode of transmission: Secondary | ICD-10-CM

## 2021-02-05 LAB — WET PREP FOR TRICH, YEAST, CLUE
MICRO NUMBER:: 11797690
Specimen Quality: ADEQUATE

## 2021-02-05 MED ORDER — METRONIDAZOLE 0.75 % VA GEL: 1.0000 | Freq: Every day | VAGINAL | 0 refills | Status: AC

## 2021-02-05 NOTE — Progress Notes (Signed)
MyChart message sent: Your wet prep was negative for yeast and trich, but did show "clue cells" which indicate bacterial vaginosis (BV). The topical antibiotic I sent in for you is what we typically use to treat BV - if it is too expensive or painful to apply, I can change to an oral version of the same medicine - just let me know. We will let you know when the other test results are in. BV does not typically cause ulcerations, so depending on what the other test results show or if your symptoms persist or worsen, I would want you to follow-up your OBGYN for this.

## 2021-02-05 NOTE — Progress Notes (Signed)
Acute Office Visit  Subjective:    Patient ID: Julie Landry, female    DOB: 02-Feb-1991, 30 y.o.   MRN: [id number]  Chief Complaint  Patient presents with  . genital ulcer    HPI Patient is in today for labia ulcer.   Patient states that on Monday she started feeling "off" and swollen in her genital area. At first she thought it may have just been from getting off of her period a few days prior, however symptoms progressed over the last few days. Yesterday it became very bothersome and felt like a burning, throbbing pain with swelling to labia minora. She took a warm shower which seemed to help with some of the discomfort temporarily. She was able to use her phone to take a picture of the area and states it looked like a white blister on the inside of labia with labial swelling. She states she has not had any changes to vaginal discharge, vaginal itching/burning, suprapubic/abdominal pain, changes in urination, fevers, malaise. Last night it was 7/10 painful that she had trouble getting comfortable to sleep. Tried taking Aleve without much improvement.  In November she had an abnormal pap with positive HPV followed by cryo in February. States she has done well since then, but has not been sexually active the past several months since all of this began. States she has had chlamydia before, but it was at least 10 years ago.   LMP: 01/25/21 (birth control with periods every 3 months)     No past medical history on file.  Past Surgical History:  Procedure Laterality Date  . ELBOW FRACTURE SURGERY    . ROTATOR CUFF REPAIR      Family History  Problem Relation Age of Onset  . Heart attack Mother   . Diabetes Mother   . Parkinson's disease Father   . Hyperlipidemia Father   . Depression Father     Social History   Socioeconomic History  . Marital status: Single    Spouse name: Not on file  . Number of children: Not on file  . Years of education: Not on file  . Highest  education level: Not on file  Occupational History  . Occupation: Press photographer Rep    Employer: [employer]  Tobacco Use  . Smoking status: Current Every Day Smoker    Packs/day: 1.00    Years: 8.00    Pack years: 8.00    Types: Cigarettes  . Smokeless tobacco: Never Used  Vaping Use  . Vaping Use: Never used  Substance and Sexual Activity  . Alcohol use: Yes  . Drug use: Never  . Sexual activity: Yes    Partners: Male    Birth control/protection: Condom  Other Topics Concern  . Not on file  Social History Narrative  . Not on file   Social Determinants of Health   Financial Resource Strain: Not on file  Food Insecurity: Not on file  Transportation Needs: Not on file  Physical Activity: Not on file  Stress: Not on file  Social Connections: Not on file  Intimate Partner Violence: Not on file    Outpatient Medications Prior to Visit  Medication Sig Dispense Refill  . doxycycline (VIBRAMYCIN) 100 MG capsule Take 100 mg by mouth 2 (two) times daily.    Marland Kitchen guaiFENesin-codeine (ROBITUSSIN AC) 100-10 MG/5ML syrup Take 5 mLs by mouth 3 (three) times daily as needed for cough. 118 mL 0  . levonorgestrel-ethinyl estradiol (SEASONALE) 0.15-0.03 MG tablet Take 1 tablet by  mouth daily.    . meloxicam (MOBIC) 15 MG tablet TAKE 1 TABLET (15 MG TOTAL) BY MOUTH DAILY. 30 tablet 0  . oseltamivir (TAMIFLU) 75 MG capsule Take 1 capsule (75 mg total) by mouth 2 (two) times daily. 10 capsule 0  . predniSONE (DELTASONE) 20 MG tablet Take 1 tablet (20 mg total) by mouth 2 (two) times daily with a meal. (Patient not taking: Reported on 11/26/2020) 10 tablet 0   No facility-administered medications prior to visit.    Allergies  Allergen Reactions  . Elemental Sulfur Nausea And Vomiting  . Sulfa Antibiotics     Review of Systems All review of systems negative except what is listed in the HPI     Objective:    Physical Exam Constitutional:      Appearance: Normal appearance. She is normal  weight.  Genitourinary:    Labia:        Right: Tenderness and lesion present. No rash.     Neurological:     General: No focal deficit present.     Mental Status: She is alert and oriented to person, place, and time. Mental status is at baseline.  Psychiatric:        Mood and Affect: Mood normal.        Behavior: Behavior normal.        Thought Content: Thought content normal.        Judgment: Judgment normal.     BP 114/73   Pulse 93   Temp 98.8 F (37.1 C)   Resp 15   SpO2 98%  Wt Readings from Last 3 Encounters:  01/29/20 169 lb 12.8 oz (77 kg)  10/04/19 181 lb 1.3 oz (82.1 kg)  07/28/19 181 lb (82.1 kg)    Health Maintenance Due  Topic Date Due  . Hepatitis C Screening  Never done  . COVID-19 Vaccine (1) Never done  . HIV Screening  Never done  . PAP SMEAR-Modifier  Never done    There are no preventive care reminders to display for this patient.   No results found for: TSH Lab Results  Component Value Date   HGB 13.9 POINT OF CARE RESULT 05/23/2008   No results found for: NA, K, CHLORIDE, CO2, GLUCOSE, BUN, CREATININE, BILITOT, ALKPHOS, AST, ALT, PROT, ALBUMIN, CALCIUM, ANIONGAP, EGFR, GFR No results found for: CHOL No results found for: HDL No results found for: LDLCALC No results found for: TRIG No results found for: CHOLHDL No results found for: HGBA1C     Assessment & Plan:   1. Screen for STD (sexually transmitted disease) 2. Labia irritation 3. Genital labial ulcer  Patient with relatively abrupt onset of genital ulcer to right labia. Sent swabs today for yeast, BV, HSV, GC, chlamydia. Given her history of abnormal pap with HPV, low threshold for GYN referral if all of these tests are negative or treatment unsuccessful. Biopsy may be necessary if no improvement. She is extremely uncomfortable - discussed antibiotic stewardship with patient but will go ahead and give her some topical Metrogel while we wait for test results. Patient aware of sign  & symptoms requiring urgent evaluation including symptoms of infection. Will follow-up when results are back and let her know any changes to plan of care.  - WET PREP FOR TRICH, YEAST, CLUE - C. trachomatis/N. gonorrhoeae RNA - SureSwab HSV, Type 1/2 DNA, PCR - metroNIDAZOLE (METROGEL) 0.75 % vaginal gel; Place 1 Applicatorful vaginally at bedtime for 5 days.  Dispense: 50 g; Refill: 0  Follow-up if symptoms worsen or fail to improve.   Terrilyn Saver, NP

## 2021-02-07 LAB — C. TRACHOMATIS/N. GONORRHOEAE RNA
C. trachomatis RNA, TMA: NOT DETECTED
N. gonorrhoeae RNA, TMA: NOT DETECTED

## 2021-02-08 LAB — SURESWAB HSV, TYPE 1/2 DNA, PCR
HSV 1 DNA: NOT DETECTED
HSV 2 DNA: NOT DETECTED

## 2021-02-09 NOTE — Progress Notes (Signed)
MyChart message sent: The rest of your tests were negative for gonorrhea, chlamydia, HSV (herpes). I recommend you get in with your OBGYN so they can evaluate the area. Let us know if you need anything else.

## 2021-09-25 ENCOUNTER — Ambulatory Visit: Payer: 59 | Admitting: Sports Medicine

## 2021-09-25 ENCOUNTER — Other Ambulatory Visit: Payer: Self-pay

## 2021-09-25 DIAGNOSIS — M5136 Other intervertebral disc degeneration, lumbar region with discogenic back pain only: Secondary | ICD-10-CM

## 2021-09-25 DIAGNOSIS — G8929 Other chronic pain: Secondary | ICD-10-CM | POA: Insufficient documentation

## 2021-09-25 MED ORDER — GABAPENTIN 300 MG PO CAPS: ORAL_CAPSULE | ORAL | 3 refills | Status: DC

## 2021-09-25 MED ORDER — TRAMADOL HCL 50 MG PO TABS: 50.0000 mg | ORAL_TABLET | Freq: Three times a day (TID) | ORAL | 0 refills | Status: DC | PRN

## 2021-09-25 NOTE — Progress Notes (Addendum)
    Procedures performed today:    None.  Independent interpretation of notes and tests performed by another provider:   Lumbar spine x-rays personally reviewed, unrevealing.  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 Unfortunately for over a year now she is had pain in her axial low back, right sided buttock with radiation down the back of the thigh but not past the knee. She is done greater than 6 weeks of formal physical therapy, medications including NSAIDs, activity modification without much improvement. X-rays unrevealing. She does not have any discomfort over the SI joint, negative Patrick's test, negative straight leg raise. I do not think this is coming from her SI joint, I do think she is having more discogenic pain. Considering failure of conservative treatment we will proceed with MRI for interventional planning, likely a right-sided epidural. Adding Neurontin for nighttime use in an up taper, and tramadol for breakthrough pain. I would also like her to have some restrictions at work. MRI results I will likely go ahead and just order an epidural.  Update: MRI noted, epidural ordered, follow-up 6 weeks after injection.    ___________________________________________ Ihor Austin. Benjamin Stain, M.D., ABFM., CAQSM. Primary Care and Sports Medicine [facility]  Adjunct Instructor of Family Medicine  [employer]

## 2021-09-25 NOTE — Assessment & Plan Note (Addendum)
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 Unfortunately for over a year now she is had pain in her axial low back, right sided buttock with radiation down the back of the thigh but not past the knee. She is done greater than 6 weeks of formal physical therapy, medications including NSAIDs, activity modification without much improvement. X-rays unrevealing. She does not have any discomfort over the SI joint, negative Patrick's test, negative straight leg raise. I do not think this is coming from her SI joint, I do think she is having more discogenic pain. Considering failure of conservative treatment we will proceed with MRI for interventional planning, likely a right-sided epidural. Adding Neurontin for nighttime use in an up taper, and tramadol for breakthrough pain. I would also like her to have some restrictions at work. MRI results I will likely go ahead and just order an epidural.  Update: MRI noted, epidural ordered, follow-up 6 weeks after injection.

## 2021-09-27 ENCOUNTER — Other Ambulatory Visit: Payer: 59

## 2021-09-27 ENCOUNTER — Ambulatory Visit (INDEPENDENT_AMBULATORY_CARE_PROVIDER_SITE_OTHER): Payer: 59

## 2021-09-27 ENCOUNTER — Other Ambulatory Visit: Payer: Self-pay

## 2021-09-27 DIAGNOSIS — M545 Low back pain, unspecified: Secondary | ICD-10-CM

## 2021-09-27 DIAGNOSIS — M25552 Pain in left hip: Secondary | ICD-10-CM

## 2021-09-27 DIAGNOSIS — M25551 Pain in right hip: Secondary | ICD-10-CM

## 2021-09-27 DIAGNOSIS — M5136 Other intervertebral disc degeneration, lumbar region: Secondary | ICD-10-CM

## 2021-09-28 NOTE — Addendum Note (Signed)
Addended by: Monica Becton on: 09/28/2021 09:48 AM   Modules accepted: Orders

## 2021-09-29 ENCOUNTER — Encounter: Payer: Self-pay | Admitting: Sports Medicine

## 2021-10-02 ENCOUNTER — Other Ambulatory Visit: Payer: Self-pay

## 2021-10-02 ENCOUNTER — Ambulatory Visit
Admission: RE | Admit: 2021-10-02 | Discharge: 2021-10-02 | Disposition: A | Discharge status: Home / Self Care | Payer: 59 | Source: Ambulatory Visit | Attending: Sports Medicine | Admitting: Sports Medicine

## 2021-10-02 DIAGNOSIS — M5136 Other intervertebral disc degeneration, lumbar region: Secondary | ICD-10-CM

## 2021-10-02 MED ORDER — IOPAMIDOL (ISOVUE-M 300) INJECTION 61%
1.0000 mL | Freq: Once | INTRAMUSCULAR | Status: AC | PRN
  Administered 2021-10-02: 1 mL via EPIDURAL

## 2021-10-02 MED ORDER — METHYLPREDNISOLONE ACETATE 40 MG/ML INJ SUSP (RADIOLOG
80.0000 mg | Freq: Once | INTRAMUSCULAR | Status: AC
  Administered 2021-10-02: 80 mg via EPIDURAL

## 2021-10-02 NOTE — Discharge Instructions (Signed)

## 2021-10-03 ENCOUNTER — Other Ambulatory Visit: Payer: 59

## 2021-10-05 DIAGNOSIS — M5020 Other cervical disc displacement, unspecified cervical region: Secondary | ICD-10-CM | POA: Insufficient documentation

## 2021-10-27 ENCOUNTER — Encounter: Payer: Self-pay | Admitting: Physician Assistant

## 2021-10-27 ENCOUNTER — Telehealth: Payer: 59 | Admitting: Physician Assistant

## 2021-10-27 DIAGNOSIS — J029 Acute pharyngitis, unspecified: Secondary | ICD-10-CM

## 2021-10-27 MED ORDER — PREDNISONE 50 MG PO TABS: 50.0000 mg | ORAL_TABLET | Freq: Every day | ORAL | 0 refills | Status: DC

## 2021-10-27 MED ORDER — LIDOCAINE VISCOUS HCL 2 % MT SOLN: 15.0000 mL | OROMUCOSAL | 0 refills | Status: DC | PRN

## 2021-10-27 NOTE — Progress Notes (Signed)
.  Virtual Visit via Video Note  I connected with Julie Landry on 10/30/21 at  4:20 PM EST by a video enabled telemedicine application and verified that I am speaking with the correct person using two identifiers.  Location: Patient: home Provider: clinic  .Marland KitchenParticipating in visit:  Patient: [name] Provider: Tandy Gaw PA-C Provider in training: Shawna Orleans PA-S   I discussed the limitations of evaluation and management by telemedicine and the availability of in person appointments. The patient expressed understanding and agreed to proceed.  History of Present Illness: Pt is a 31 yo female who calls into the clinic with sore throat that is worsening. She felt "rough" this weekend but did not have the sore throat until yesterday. Denies any sinus pressure, ear pain, headache, cough, SOB. Her throat feels "raw". It was hard for her to work today because she works around chemicals that already irritate her throat. No known fever, chills, body aches. She is using nyquil honey with some relief. No problems swallowing.   .. 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  Diagnoses and all orders for this visit:  Acute pharyngitis, unspecified etiology -     predniSONE (DELTASONE) 50 MG tablet; Take 1 tablet (50 mg total) by mouth daily. -     lidocaine (XYLOCAINE) 2 % solution; Use as directed 15 mLs in the mouth or throat every 3 (three) hours as needed (mouth/throat pain - gargle and spit).   Take covid test.  No body aches or fever less  likely flu. Suspect viral since no fever, tachycardia, lymph node swelling.  Given prednisone and numbing mouth wash Continue tylenol and ibuprofen Salt water gargle and follow up as needed or if symptoms worsen.    Follow Up Instructions:    I discussed the assessment and treatment plan with the patient. The patient was provided an opportunity to ask questions and all were answered. The patient agreed with the plan and demonstrated an understanding of the instructions.   The patient was advised to call back or seek an in-person evaluation if the symptoms worsen or if the condition fails to improve as anticipated.    Tandy Gaw, PA-C

## 2021-10-28 ENCOUNTER — Telehealth: Payer: Self-pay | Admitting: Neurology

## 2021-10-28 ENCOUNTER — Other Ambulatory Visit: Payer: Self-pay | Admitting: Physician Assistant

## 2021-10-28 MED ORDER — AMOXICILLIN 875 MG PO TABS: 875.0000 mg | ORAL_TABLET | Freq: Two times a day (BID) | ORAL | 0 refills | Status: DC

## 2021-10-28 NOTE — Telephone Encounter (Signed)
Received the below message in a scheduling request:   Muck, Army Melia  You; Nolene Ebbs; Ernest Mallick A, CMA 30 minutes ago (7:54 AM)   AM Please see message below, patient had a virtual with Adorian Gwynne yesterday afternoon.       Diana Eves  Patient Appointment Schedule Request Pool 4 hours ago (3:47 AM)   BT After my virtual appointment yesterday afternoon with Dr. Caleen Essex, I pick up my medications and took both before sleeping. Unfortunately this morning when I woke up to get ready for work, I now have developed small white pockets in the back of my throat Ive been sweating pretty much all during the night. Not sure if these medications she prescribed yesterday treats strep throat or not, current symptoms are still very very raw throat now with white pockets visible (which I did not have yesterday) and maybe now I have a fever, nose is a little congested.      Patient given Lidocaine mouth/throat rinse and Prednisone. Please advise.

## 2021-10-28 NOTE — Telephone Encounter (Signed)
Sent amoxil bid for 10 days to use in combination with other prescribed medications.

## 2021-10-28 NOTE — Progress Notes (Signed)
White spots on back of throat. Sent amoxil.

## 2021-10-28 NOTE — Telephone Encounter (Signed)
Patient made aware.

## 2021-10-30 ENCOUNTER — Encounter: Payer: Self-pay | Admitting: Physician Assistant

## 2021-11-02 ENCOUNTER — Telehealth (INDEPENDENT_AMBULATORY_CARE_PROVIDER_SITE_OTHER): Payer: 59 | Admitting: Physician Assistant

## 2021-11-02 DIAGNOSIS — N760 Acute vaginitis: Secondary | ICD-10-CM | POA: Diagnosis not present

## 2021-11-02 DIAGNOSIS — J40 Bronchitis, not specified as acute or chronic: Secondary | ICD-10-CM | POA: Diagnosis not present

## 2021-11-02 DIAGNOSIS — J329 Chronic sinusitis, unspecified: Secondary | ICD-10-CM

## 2021-11-02 MED ORDER — FLUCONAZOLE 150 MG PO TABS: 150.0000 mg | ORAL_TABLET | Freq: Once | ORAL | 0 refills | Status: AC

## 2021-11-02 MED ORDER — FLUTICASONE PROPIONATE 50 MCG/ACT NA SUSP: 2.0000 | Freq: Every day | NASAL | 0 refills | Status: DC

## 2021-11-02 MED ORDER — IBUPROFEN 800 MG PO TABS: 800.0000 mg | ORAL_TABLET | Freq: Three times a day (TID) | ORAL | 0 refills | Status: DC | PRN

## 2021-11-02 NOTE — Progress Notes (Signed)
Sore throat has gotten better pt states she may have a yeast infection pt has been taking 1 pill a day ,pt sates she sinus congestion ears are muffled head pain pt taking zyrtec.

## 2021-11-02 NOTE — Patient Instructions (Signed)
Mucinex twice a day

## 2021-11-03 ENCOUNTER — Encounter: Payer: Self-pay | Admitting: Physician Assistant

## 2021-11-03 NOTE — Progress Notes (Signed)
Patient ID: Julie Landry, female   DOB: 10-05-1991, 31 y.o.   MRN: [id number] .Marland KitchenVirtual Visit via Video Note  I connected with Julie Landry on 11/02/2021 at 10:10 AM EST by a video enabled telemedicine application and verified that I am speaking with the correct person using two identifiers.  Location: Patient: home Provider: clinic  .Marland KitchenParticipating in visit:  Patient: Julie Landry Provider: [place] Planas PA-C Provider in training: Judithann Sheen PA-S   I discussed the limitations of evaluation and management by telemedicine and the availability of in person appointments. The patient expressed understanding and agreed to proceed.  History of Present Illness: Pt is a 31 yo female who calls back to the clinic after last week being treated with amoxil/prednisone for strep throat empirically based on reported symptoms. She has since developed vaginal itching/discharge and more head and chest congestion. No problems breathing. She did test for covid and negative. She has finished the prednisone. No fever, chills.    .. 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  Diagnoses and all orders for this visit:  Sinobronchitis -     fluticasone (FLONASE) 50 MCG/ACT nasal spray; Place 2 sprays into both nostrils daily. -     ibuprofen (ADVIL) 800 MG tablet; Take 1 tablet (800 mg total) by mouth every 8 (eight) hours as needed.  Acute vaginitis -     fluconazole (DIFLUCAN) 150 MG tablet; Take 1 tablet (150 mg total) by mouth once for 1 dose. Repeat in 48-72 hours if symptoms persist.   Likely yeast from abx. Sent diflucan.  Likely more viral as why amoxil did  not treat 100 percent.  Continue mucinex twice a day.  Added flonase daily.  Ibuprofen 800mg  for aches and pains.  Rest and hydrate.  Finish amoxil.  Follow up if not improving or if symptoms worsening like trouble breathing.     Follow Up Instructions:    I discussed the assessment and treatment plan with the patient. The patient was provided an opportunity to ask questions and all were answered. The patient agreed with the plan and demonstrated an understanding of the instructions.   The patient was advised to call back or seek an in-person evaluation if the symptoms worsen or if the condition fails to improve as anticipated.   Iran Planas, PA-C

## 2021-11-13 ENCOUNTER — Ambulatory Visit: Payer: 59 | Admitting: Sports Medicine

## 2021-11-13 ENCOUNTER — Other Ambulatory Visit: Payer: Self-pay

## 2021-11-13 DIAGNOSIS — M5136 Other intervertebral disc degeneration, lumbar region with discogenic back pain only: Secondary | ICD-10-CM

## 2021-11-13 MED ORDER — PREGABALIN 50 MG PO CAPS: ORAL_CAPSULE | ORAL | 3 refills | Status: DC

## 2021-11-13 NOTE — Assessment & Plan Note (Addendum)
Pleasant 31 year old female, she does have a fairly laborious job, we have been treating her for lumbar degenerative disc disease, after failure of conservative treatment we obtained an MRI that showed shallow disc protrusions from L4-S1, she had a L5-S1 interlaminar epidural and has done really well, numbness and tingling is gone but she still has significant axial pain. Axial pain is discogenic in nature.  Right-sided. Proceed with epidural #2, she is also not tolerating gabapentin so we will discontinue this and switch to Lyrica 1 week later, we discussed an up taper. She will continue light duty. She can continue tramadol for breakthrough pain in the meantime. Return to see me 1 month after the epidural.  [name] and I also discussed that I be okay to take her as a new patient for primary care if she would like.

## 2021-11-13 NOTE — Progress Notes (Addendum)
° ° °  Procedures performed today:    None.  Independent interpretation of notes and tests performed by another provider:   None.  Brief History, Exam, Impression, and Recommendations:    Discogenic low back pain Pleasant 31 year old female, she does have a fairly laborious job, we have been treating her for lumbar degenerative disc disease, after failure of conservative treatment we obtained an MRI that showed shallow disc protrusions from L4-S1, she had a L5-S1 interlaminar epidural and has done really well, numbness and tingling is gone but she still has significant axial pain. Axial pain is discogenic in nature.  Right-sided. Proceed with epidural #2, she is also not tolerating gabapentin so we will discontinue this and switch to Lyrica 1 week later, we discussed an up taper. She will continue light duty. She can continue tramadol for breakthrough pain in the meantime. Return to see me 1 month after the epidural.  [name] and I also discussed that I be okay to take her as a new patient for primary care if she would like.    ___________________________________________ Ihor Austin. Benjamin Stain, M.D., ABFM., CAQSM. Primary Care and Sports Medicine [facility]  Adjunct Instructor of Family Medicine  [employer]

## 2021-11-24 ENCOUNTER — Other Ambulatory Visit: Payer: Self-pay | Admitting: Physician Assistant

## 2021-11-24 DIAGNOSIS — J329 Chronic sinusitis, unspecified: Secondary | ICD-10-CM

## 2021-11-27 ENCOUNTER — Encounter: Payer: Self-pay | Admitting: Sports Medicine

## 2021-11-27 DIAGNOSIS — M5136 Other intervertebral disc degeneration, lumbar region with discogenic back pain only: Secondary | ICD-10-CM

## 2021-11-27 MED ORDER — PREGABALIN 25 MG PO CAPS: ORAL_CAPSULE | ORAL | 0 refills | Status: DC

## 2021-12-11 ENCOUNTER — Ambulatory Visit: Payer: 59 | Admitting: Sports Medicine

## 2021-12-24 ENCOUNTER — Ambulatory Visit
Admission: RE | Admit: 2021-12-24 | Discharge: 2021-12-24 | Disposition: A | Discharge status: Home / Self Care | Payer: 59 | Source: Ambulatory Visit | Attending: Sports Medicine | Admitting: Sports Medicine

## 2021-12-24 ENCOUNTER — Other Ambulatory Visit: Payer: Self-pay

## 2021-12-24 DIAGNOSIS — M5136 Other intervertebral disc degeneration, lumbar region: Secondary | ICD-10-CM

## 2021-12-24 MED ORDER — METHYLPREDNISOLONE ACETATE 40 MG/ML INJ SUSP (RADIOLOG
80.0000 mg | Freq: Once | INTRAMUSCULAR | Status: AC
  Administered 2021-12-24: 80 mg via EPIDURAL

## 2021-12-24 MED ORDER — IOPAMIDOL (ISOVUE-M 200) INJECTION 41%
1.0000 mL | Freq: Once | INTRAMUSCULAR | Status: AC
  Administered 2021-12-24: 1 mL via EPIDURAL

## 2021-12-24 NOTE — Discharge Instructions (Signed)

## 2022-01-07 ENCOUNTER — Other Ambulatory Visit: Payer: Self-pay | Admitting: Sports Medicine

## 2022-01-07 DIAGNOSIS — M5136 Other intervertebral disc degeneration, lumbar region: Secondary | ICD-10-CM

## 2022-01-08 MED ORDER — PREGABALIN 25 MG PO CAPS: 25.0000 mg | ORAL_CAPSULE | Freq: Every day | ORAL | 1 refills | Status: DC

## 2022-03-10 ENCOUNTER — Ambulatory Visit: Payer: 59 | Admitting: Sports Medicine

## 2022-03-10 DIAGNOSIS — M5136 Other intervertebral disc degeneration, lumbar region with discogenic back pain only: Secondary | ICD-10-CM

## 2022-03-10 NOTE — Assessment & Plan Note (Addendum)
Julie Landry returns, she is a very pleasant 31 year old female, has a fairly laborious job. We have been treating her for lumbar DDD, chronic axial right-sided low back pain. Ultimately after failure of conservative treatment including spinal conditioning we obtained an MRI that showed a shallow disc protrusion at L5-S1. She had an L5-S1 interlaminar epidural x2, the initial injection did well, the second really did not. Pain was axial, discogenic, right-sided, today she is localizing it to the right sacroiliac joint. We tried gabapentin and Lyrica, this only makes her sleepy, discontinue these per Occasional tramadol seems to help. She has also developed some depressive symptoms related to her pain. I have given her several recommendations, out of work on short-term disability until we get this under control, a consultation with our neurosurgeon downstairs, a trial right-sided sacroiliac joint injection with ultrasound guidance in my office. I would also like her to consider Cymbalta. She will let me know which of these she would like me to proceed with.  Update: Julie Landry messaged [place] back, she would like to start Cymbalta, get a surgical opinion, she would also like to be written out of work and she will bring her FMLA/disability paperwork with her for me to fill out when we do her SI joint injection.

## 2022-03-10 NOTE — Progress Notes (Addendum)
    Procedures performed today:    None.  Independent interpretation of notes and tests performed by another provider:   None.  Brief History, Exam, Impression, and Recommendations:    Discogenic low back pain Julie Landry returns, she is a very pleasant 31 year old female, has a fairly laborious job. We have been treating her for lumbar DDD, chronic axial right-sided low back pain. Ultimately after failure of conservative treatment including spinal conditioning we obtained an MRI that showed a shallow disc protrusion at L5-S1. She had an L5-S1 interlaminar epidural x2, the initial injection did well, the second really did not. Pain was axial, discogenic, right-sided, today she is localizing it to the right sacroiliac joint. We tried gabapentin and Lyrica, this only makes her sleepy, discontinue these per Occasional tramadol seems to help. She has also developed some depressive symptoms related to her pain. I have given her several recommendations, out of work on short-term disability until we get this under control, a consultation with our neurosurgeon downstairs, a trial right-sided sacroiliac joint injection with ultrasound guidance in my office. I would also like her to consider Cymbalta. She will let me know which of these she would like me to proceed with.  Update: Julie Landry messaged [place] back, she would like to start Cymbalta, get a surgical opinion, she would also like to be written out of work and she will bring her FMLA/disability paperwork with her for me to fill out when we do her SI joint injection.  I spent 30 minutes of total time managing this patient today, this includes chart review, face to face, and non-face to face time.  ___________________________________________ Ihor Austin. Benjamin Stain, M.D., ABFM., CAQSM. Primary Care and Sports Medicine [facility]  Adjunct Instructor of Family Medicine  [employer]

## 2022-03-11 ENCOUNTER — Encounter: Payer: Self-pay | Admitting: Sports Medicine

## 2022-03-12 MED ORDER — DULOXETINE HCL 30 MG PO CPEP: 30.0000 mg | ORAL_CAPSULE | Freq: Every day | ORAL | 3 refills | Status: DC

## 2022-03-12 NOTE — Addendum Note (Signed)
Addended by: Monica Becton on: 03/12/2022 11:52 AM   Modules accepted: Orders

## 2022-03-19 ENCOUNTER — Ambulatory Visit: Payer: 59 | Admitting: Sports Medicine

## 2022-03-19 ENCOUNTER — Ambulatory Visit (INDEPENDENT_AMBULATORY_CARE_PROVIDER_SITE_OTHER): Payer: 59

## 2022-03-19 DIAGNOSIS — M5136 Other intervertebral disc degeneration, lumbar region with discogenic back pain only: Secondary | ICD-10-CM

## 2022-03-19 NOTE — Assessment & Plan Note (Addendum)
Pleasant 31 year old female, works a very laborious job, we have been treating her for lumbar DDD, chronic axial right-sided low back pain, she had an MRI after conservative treatment and spinal conditioning, the MRI showed a shallow disc protrusion L5-S1, she has had 2 L5-S1 interlaminar epidurals, first helped, the second did not. She also has had gabapentin, then switched to Lyrica, both caused drowsiness, she is also on tramadol as needed for pain. We also added some Cymbalta for potential depressive symptoms related to pain, has not yet started it. Her pain at the last visit was referable to the sacroiliac joint so I injected this today with ultrasound guidance, she did have good immediate relief following the injection. We also are going to get a neurosurgical consultation with Dr. Franky Macho downstairs, this will be in a couple of weeks. Today we also filled out her disability paperwork, we will send off the office note from today, as well as her imaging studies with the disability.  I would like to see what Dr. Franky Macho says, I did inform her that if she did not get long-term relief from the injection today we could certainly consider cooled or radiofrequency ablation of the SI joint versus SI joint fusion.

## 2022-03-19 NOTE — Progress Notes (Signed)
    Procedures performed today:    Procedure: Real-time Ultrasound Guided injection of the right sacroiliac joint Device: Samsung HS60  Verbal informed consent obtained.  Time-out conducted.  Noted no overlying erythema, induration, or other signs of local infection.  Skin prepped in a sterile fashion.  Local anesthesia: Topical Ethyl chloride.  With sterile technique and under real time ultrasound guidance: Mild spurring noted, 1 cc Kenalog 40, 2 cc lidocaine, 2 cc bupivacaine injected easily Completed without difficulty  Advised to call if fevers/chills, erythema, induration, drainage, or persistent bleeding.  Images permanently stored and available for review in PACS.  Impression: Technically successful ultrasound guided injection.  Independent interpretation of notes and tests performed by another provider:   None.  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 She also has had gabapentin, then switched to Lyrica, both caused drowsiness, she is also on tramadol as needed for pain. We also added some Cymbalta for potential depressive symptoms related to pain, has not yet started it. Her pain at the last visit was referable to the sacroiliac joint so I injected this today with ultrasound guidance, she did have good immediate relief following the injection. We also are going to get a neurosurgical consultation with Dr. Christella Noa downstairs, this will be in a couple of weeks. Today we also filled out her disability paperwork, we will send off the office note from today, as well as her imaging studies with the disability.  I  would like to see what Dr. Christella Noa says, I did inform her that if she did not get long-term relief from the injection today we could certainly consider cooled or radiofrequency ablation of the SI joint versus SI joint fusion.    ___________________________________________ Gwen Her. Dianah Field, M.D., ABFM., CAQSM. Primary Care and [place] Instructor of [place] of [facility] of [employer]

## 2022-03-22 ENCOUNTER — Ambulatory Visit: Payer: 59 | Admitting: Sports Medicine

## 2022-04-03 ENCOUNTER — Other Ambulatory Visit: Payer: Self-pay | Admitting: Sports Medicine

## 2022-04-03 DIAGNOSIS — M5136 Other intervertebral disc degeneration, lumbar region: Secondary | ICD-10-CM

## 2022-04-05 MED ORDER — TRAMADOL HCL 50 MG PO TABS: 50.0000 mg | ORAL_TABLET | Freq: Three times a day (TID) | ORAL | 0 refills | Status: DC | PRN

## 2022-04-16 ENCOUNTER — Ambulatory Visit: Payer: 59 | Admitting: Sports Medicine

## 2022-04-16 ENCOUNTER — Encounter (INDEPENDENT_AMBULATORY_CARE_PROVIDER_SITE_OTHER): Payer: 59 | Admitting: Sports Medicine

## 2022-04-16 DIAGNOSIS — M5136 Other intervertebral disc degeneration, lumbar region with discogenic back pain only: Secondary | ICD-10-CM

## 2022-04-16 DIAGNOSIS — Z0000 Encounter for general adult medical examination without abnormal findings: Secondary | ICD-10-CM | POA: Diagnosis not present

## 2022-04-16 DIAGNOSIS — G8929 Other chronic pain: Secondary | ICD-10-CM

## 2022-04-16 NOTE — Progress Notes (Signed)
    Procedures performed today:    None.  Independent interpretation of notes and tests performed by another provider:   None.  Brief History, Exam, Impression, and Recommendations:    Low back and buttock pain, right Julie Landry returns, she is a pleasant 31 year old female, she has had epidurals, she has had an SI joint injection. She got some relief from both procedures, but more relief from the SI joint injection, today her pain is predominantly at the piriformis. She did see Dr. Franky Macho who has suggested a lumbar fusion, I have suggested against this for now. She will decrease her tramadol, she will go ahead and start Cymbalta which has not yet been started, and we will use some Flexeril. I am going to give her some core conditioning, piriformis conditioning, and she will work on hip extension/glute exercises. She does have a follow-up in a couple of weeks, but understands that this will probably take 6 weeks to really take effect.   Annual physical exam Julie Landry has asked me to be her PCP, happy to do this, when she comes back she will come fasting and we can get some labs.  I spent 30 minutes of total time managing this patient today, this includes chart review, face to face, and non-face to face time. ____________________________________________ Ihor Austin. Benjamin Stain, M.D., ABFM., CAQSM., AME. Primary Care and Sports Medicine [place] [facility]  Adjunct Professor of Family Medicine  [place] of [facility] [employer]  Restaurant manager, fast food

## 2022-04-16 NOTE — Assessment & Plan Note (Signed)
Julie Landry has asked me to be her PCP, happy to do this, when she comes back she will come fasting and we can get some labs.

## 2022-04-16 NOTE — Assessment & Plan Note (Signed)
Julie Landry returns, she is a pleasant 31 year old female, she has had epidurals, she has had an SI joint injection. She got some relief from both procedures, but more relief from the SI joint injection, today her pain is predominantly at the piriformis. She did see Dr. Franky Macho who has suggested a lumbar fusion, I have suggested against this for now. She will decrease her tramadol, she will go ahead and start Cymbalta which has not yet been started, and we will use some Flexeril. I am going to give her some core conditioning, piriformis conditioning, and she will work on hip extension/glute exercises. She does have a follow-up in a couple of weeks, but understands that this will probably take 6 weeks to really take effect.

## 2022-04-19 MED ORDER — CYCLOBENZAPRINE HCL 10 MG PO TABS: ORAL_TABLET | ORAL | 0 refills | Status: DC

## 2022-04-23 NOTE — Telephone Encounter (Signed)
I spent 5 total minutes of online digital evaluation and management services in this patient-initiated request for online care. 

## 2022-04-30 ENCOUNTER — Ambulatory Visit: Payer: 59 | Admitting: Sports Medicine

## 2022-04-30 DIAGNOSIS — Z0000 Encounter for general adult medical examination without abnormal findings: Secondary | ICD-10-CM

## 2022-04-30 DIAGNOSIS — G8929 Other chronic pain: Secondary | ICD-10-CM

## 2022-04-30 DIAGNOSIS — M545 Low back pain, unspecified: Secondary | ICD-10-CM | POA: Diagnosis not present

## 2022-04-30 DIAGNOSIS — R739 Hyperglycemia, unspecified: Secondary | ICD-10-CM | POA: Diagnosis not present

## 2022-04-30 NOTE — Assessment & Plan Note (Signed)
Ordering routine labs today.  She is fasting.

## 2022-04-30 NOTE — Assessment & Plan Note (Signed)
Pleasant 31 year old female, she has chronic axial right-sided back and buttock pain, she has had epidurals, and SI joint injection, got relief from both procedures but more from the SI joint injection. She has seen Dr. Franky Macho who suggested a lumbar fusion, I have suggested against this for now. At the last visit pain was predominantly at the piriformis and she did have relatively weak hip abductors. She has been working hard on hip abductor and glutes conditioning, and has started to improve. She has also started her Cymbalta. She will have another visit with me at sometime at the end of the month for additional paperwork and then we will do an actual clinical follow-up in about a month which will take her out 6 weeks from starting Cymbalta and starting her hip abductor conditioning.

## 2022-04-30 NOTE — Progress Notes (Signed)
    Procedures performed today:    None.  Independent interpretation of notes and tests performed by another provider:   None.  Brief History, Exam, Impression, and Recommendations:    Annual physical exam Ordering routine labs today.  She is fasting.  Chronic right-sided low back pain without sciatica, potentially SI joint dysfunction versus piriformis syndrome Pleasant 31 year old female, she has chronic axial right-sided back and buttock pain, she has had epidurals, and SI joint injection, got relief from both procedures but more from the SI joint injection. She has seen Dr. Franky Macho who suggested a lumbar fusion, I have suggested against this for now. At the last visit pain was predominantly at the piriformis and she did have relatively weak hip abductors. She has been working hard on hip abductor and glutes conditioning, and has started to improve. She has also started her Cymbalta. She will have another visit with me at sometime at the end of the month for additional paperwork and then we will do an actual clinical follow-up in about a month which will take her out 6 weeks from starting Cymbalta and starting her hip abductor conditioning.    ____________________________________________ Ihor Austin. Benjamin Stain, M.D., ABFM., CAQSM., AME. Primary Care and Sports Medicine [place] [facility]  Adjunct Professor of Family Medicine  [place] of [facility] of [employer]  Restaurant manager, fast food

## 2022-05-01 LAB — COMPLETE METABOLIC PANEL WITH GFR
AG Ratio: 1.6 (calc) (ref 1.0–2.5)
ALT: 13 U/L (ref 6–29)
AST: 12 U/L (ref 10–30)
Albumin: 4.4 g/dL (ref 3.6–5.1)
Alkaline phosphatase (APISO): 38 U/L (ref 31–125)
BUN/Creatinine Ratio: 11 (calc) (ref 6–22)
BUN: 11 mg/dL (ref 7–25)
CO2: 25 mmol/L (ref 20–32)
Calcium: 9.5 mg/dL (ref 8.6–10.2)
Chloride: 106 mmol/L (ref 98–110)
Creat: 1.02 mg/dL — ABNORMAL HIGH (ref 0.50–0.97)
Globulin: 2.8 g/dL (calc) (ref 1.9–3.7)
Glucose, Bld: 71 mg/dL (ref 65–99)
Potassium: 4.4 mmol/L (ref 3.5–5.3)
Sodium: 139 mmol/L (ref 135–146)
Total Bilirubin: 0.3 mg/dL (ref 0.2–1.2)
Total Protein: 7.2 g/dL (ref 6.1–8.1)
eGFR: 75 mL/min/{1.73_m2} (ref >=60)

## 2022-05-01 LAB — CBC
HCT: 42.3 % (ref 35.0–45.0)
Hemoglobin: 13.6 g/dL (ref 11.7–15.5)
MCH: 29.2 pg (ref 27.0–33.0)
MCHC: 32.2 g/dL (ref 32.0–36.0)
MCV: 91 fL (ref 80.0–100.0)
MPV: 9.9 fL (ref 7.5–12.5)
Platelets: 378 10*3/uL (ref 140–400)
RBC: 4.65 10*6/uL (ref 3.80–5.10)
RDW: 12.5 % (ref 11.0–15.0)
WBC: 8.9 10*3/uL (ref 3.8–10.8)

## 2022-05-01 LAB — LIPID PANEL
Cholesterol: 182 mg/dL (ref <200)
HDL: 51 mg/dL (ref >=50)
LDL Cholesterol (Calc): 109 mg/dL (calc) — ABNORMAL HIGH
Non-HDL Cholesterol (Calc): 131 mg/dL (calc) — ABNORMAL HIGH (ref <130)
Total CHOL/HDL Ratio: 3.6 (calc) (ref <5.0)
Triglycerides: 116 mg/dL (ref <150)

## 2022-05-01 LAB — HEMOGLOBIN A1C
Hgb A1c MFr Bld: 4.9 % of total Hgb (ref <5.7)
Mean Plasma Glucose: 94 mg/dL
eAG (mmol/L): 5.2 mmol/L

## 2022-05-01 LAB — TSH: TSH: 1.39 mIU/L

## 2022-05-14 ENCOUNTER — Ambulatory Visit (INDEPENDENT_AMBULATORY_CARE_PROVIDER_SITE_OTHER): Payer: 59

## 2022-05-14 ENCOUNTER — Ambulatory Visit: Payer: 59 | Admitting: Sports Medicine

## 2022-05-14 DIAGNOSIS — M5136 Other intervertebral disc degeneration, lumbar region with discogenic back pain only: Secondary | ICD-10-CM

## 2022-05-14 DIAGNOSIS — R069 Unspecified abnormalities of breathing: Secondary | ICD-10-CM | POA: Diagnosis not present

## 2022-05-14 DIAGNOSIS — R058 Other specified cough: Secondary | ICD-10-CM | POA: Diagnosis not present

## 2022-05-14 DIAGNOSIS — G8929 Other chronic pain: Secondary | ICD-10-CM

## 2022-05-14 DIAGNOSIS — M545 Low back pain, unspecified: Secondary | ICD-10-CM

## 2022-05-14 MED ORDER — BENZONATATE 200 MG PO CAPS: 200.0000 mg | ORAL_CAPSULE | Freq: Three times a day (TID) | ORAL | 0 refills | Status: DC | PRN

## 2022-05-14 MED ORDER — CYCLOBENZAPRINE HCL 10 MG PO TABS: ORAL_TABLET | ORAL | 3 refills | Status: DC

## 2022-05-14 NOTE — Assessment & Plan Note (Addendum)
Pleasant 31 year old female, to recap she has chronic axial right-sided low back and buttock pain, she had epidurals, she had an SI joint injection, she did get relief from both procedures but more from the SI joint injection. We did get a consultation with neurosurgery who suggested a lumbar fusion, I have suggested against this for now considering her age and the size of her disc protrusion. Historically her pain was predominantly at the piriformis at the last visit, she did have relatively weak hip abductors, she is working hard on hip abductor and gluteus conditioning. Cymbalta also seems to have helped mood and pain. She is running out of Flexeril and would need some more, happy to call this in. Ultimately I think she needs to continue this conditioning until the previous agreed-upon date in September before she is capable of returning to work. We did receive the disability paperwork, all they are asking for his records as well as the note from today and the note from the previous visit.

## 2022-05-14 NOTE — Progress Notes (Addendum)
    Procedures performed today:    None.  Independent interpretation of notes and tests performed by another provider:   None.  Brief History, Exam, Impression, and Recommendations:    Chronic right-sided low back pain without sciatica, potentially SI joint dysfunction versus piriformis syndrome Pleasant 31 year old female, to recap she has chronic axial right-sided low back and buttock pain, she had epidurals, she had an SI joint injection, she did get relief from both procedures but more from the SI joint injection. We did get a consultation with neurosurgery who suggested a lumbar fusion, I have suggested against this for now considering her age and the size of her disc protrusion. Historically her pain was predominantly at the piriformis at the last visit, she did have relatively weak hip abductors, she is working hard on hip abductor and gluteus conditioning. Cymbalta also seems to have helped mood and pain. She is running out of Flexeril and would need some more, happy to call this in. Ultimately I think she needs to continue this conditioning until the previous agreed-upon date in September before she is capable of returning to work. We did receive the disability paperwork, all they are asking for his records as well as the note from today and the note from the previous visit.  Dry cough Also having a dry cough for about 2 weeks now, mild achiness particularly in her back, ear pain. Exam is unrevealing with the exception of some minimally coarse sounds right lower lobe, adding a chest x-ray, Tessalon Perles.    ____________________________________________ Ihor Austin. Benjamin Stain, M.D., ABFM., CAQSM., AME. Primary Care and Sports Medicine [place] [facility]  Adjunct Professor of Family Medicine  [place] of [facility] of Medicine  Restaurant manager, fast food

## 2022-05-14 NOTE — Assessment & Plan Note (Signed)
Also having a dry cough for about 2 weeks now, mild achiness particularly in her back, ear pain. Exam is unrevealing with the exception of some minimally coarse sounds right lower lobe, adding a chest x-ray, Tessalon Perles.

## 2022-05-31 ENCOUNTER — Ambulatory Visit: Payer: 59 | Admitting: Sports Medicine

## 2022-05-31 DIAGNOSIS — G8929 Other chronic pain: Secondary | ICD-10-CM | POA: Diagnosis not present

## 2022-05-31 DIAGNOSIS — M545 Low back pain, unspecified: Secondary | ICD-10-CM

## 2022-05-31 NOTE — Progress Notes (Addendum)
    Procedures performed today:    None.  Independent interpretation of notes and tests performed by another provider:   None.  Brief History, Exam, Impression, and Recommendations:    Chronic right-sided low back pain without sciatica, potentially SI joint dysfunction versus piriformis syndrome [name] returns, she is a pleasant 31 year old female, chronic axial right-sided low back pain, buttock pain, she had epidurals, SI joint injection, neurosurgery consultation, physical therapy, Cymbalta, Flexeril. Overall continues to improve. All of the above injections have provided her some relief. Neurosurgery did suggest lumbar fusion which we have decided was not a good idea for her at this juncture. Her bilateral hip abductor strength is good, she has no areas of tenderness to palpation, she does get occasional soreness after intense physical activity but we discussed how this was normal for someone her age. We will go ahead and put her back on restricted duty for 3 weeks followed by full duty, but she would need an appointment with me before returning to full duty.  Update: An additional [place] disability form was received 05/31/2022 and filled out/faxed and scanned 06/01/2022.    ____________________________________________ Ihor Austin. Benjamin Stain, M.D., ABFM., CAQSM., AME. Primary Care and Sports Medicine [place] [facility]  Adjunct Professor of Family Medicine  [place] of [facility] of Medicine  Restaurant manager, fast food

## 2022-05-31 NOTE — Assessment & Plan Note (Addendum)
Julie Landry returns, she is a pleasant 31 year old female, chronic axial right-sided low back pain, buttock pain, she had epidurals, SI joint injection, neurosurgery consultation, physical therapy, Cymbalta, Flexeril. Overall continues to improve. All of the above injections have provided her some relief. Neurosurgery did suggest lumbar fusion which we have decided was not a good idea for her at this juncture. Her bilateral hip abductor strength is good, she has no areas of tenderness to palpation, she does get occasional soreness after intense physical activity but we discussed how this was normal for someone her age. We will go ahead and put her back on restricted duty for 3 weeks followed by full duty, but she would need an appointment with me before returning to full duty.  Update: An additional Julie Landry disability form was received 05/31/2022 and filled out/faxed and scanned 06/01/2022.

## 2022-06-04 ENCOUNTER — Other Ambulatory Visit: Payer: Self-pay | Admitting: Sports Medicine

## 2022-06-04 DIAGNOSIS — M5136 Other intervertebral disc degeneration, lumbar region: Secondary | ICD-10-CM

## 2022-06-22 ENCOUNTER — Ambulatory Visit: Payer: 59 | Admitting: Sports Medicine

## 2022-06-22 DIAGNOSIS — G8929 Other chronic pain: Secondary | ICD-10-CM | POA: Diagnosis not present

## 2022-06-22 DIAGNOSIS — M545 Low back pain, unspecified: Secondary | ICD-10-CM | POA: Diagnosis not present

## 2022-06-22 NOTE — Assessment & Plan Note (Signed)
Previous history:  Julie Landry returns, she is a pleasant 31 year old female, chronic axial right-sided low back pain, buttock pain, she had epidurals, SI joint injection, neurosurgery consultation, physical therapy, Cymbalta, Flexeril. Overall continues to improve. All of the above injections have provided her some relief. Neurosurgery did suggest lumbar fusion which we have decided was not a good idea for her at this juncture. Her bilateral hip abductor strength is good, she has no areas of tenderness to palpation, she does get occasional soreness after intense physical activity but we discussed how this was normal for someone her age. We will go ahead and put her back on restricted duty for 3 weeks followed by full duty, but she would need an appointment with me before returning to full duty.  Interim history for today: Julie Landry continues to improve slowly, she still has some discomfort, particularly with walking and steel toed boots for greater than 30 minutes. We have updated her work restrictions, return to see me in 6 weeks to reevaluate.

## 2022-06-22 NOTE — Progress Notes (Signed)
    Procedures performed today:    None.  Independent interpretation of notes and tests performed by another provider:   None.  Brief History, Exam, Impression, and Recommendations:    Chronic right-sided low back pain without sciatica, potentially SI joint dysfunction versus piriformis syndrome Previous history:  [name] returns, she is a pleasant 31 year old female, chronic axial right-sided low back pain, buttock pain, she had epidurals, SI joint injection, neurosurgery consultation, physical therapy, Cymbalta, Flexeril. Overall continues to improve. All of the above injections have provided her some relief. Neurosurgery did suggest lumbar fusion which we have decided was not a good idea for her at this juncture. Her bilateral hip abductor strength is good, she has no areas of tenderness to palpation, she does get occasional soreness after intense physical activity but we discussed how this was normal for someone her age. We will go ahead and put her back on restricted duty for 3 weeks followed by full duty, but she would need an appointment with me before returning to full duty.  Interim history for today: [name] continues to improve slowly, she still has some discomfort, particularly with walking and steel toed boots for greater than 30 minutes. We have updated her work restrictions, return to see me in 6 weeks to reevaluate.    ____________________________________________ Ihor Austin. Benjamin Stain, M.D., ABFM., CAQSM., AME. Primary Care and Sports Medicine [place] [facility]  Adjunct Professor of Family Medicine  [place] of [facility] [employer]  Restaurant manager, fast food

## 2022-08-03 ENCOUNTER — Ambulatory Visit: Payer: 59 | Admitting: Sports Medicine

## 2022-08-11 IMAGING — XA Imaging study
2 series · 2 of 2 positions shown · non-contrast
Comparison: none

CLINICAL DATA: Spondylosis without myelopathy. Some improvement
from the previous injection but with persistent back pain

[Series 1: ortho standard · 1 of 1 slices shown (1 of 2)]
[im 1/1]
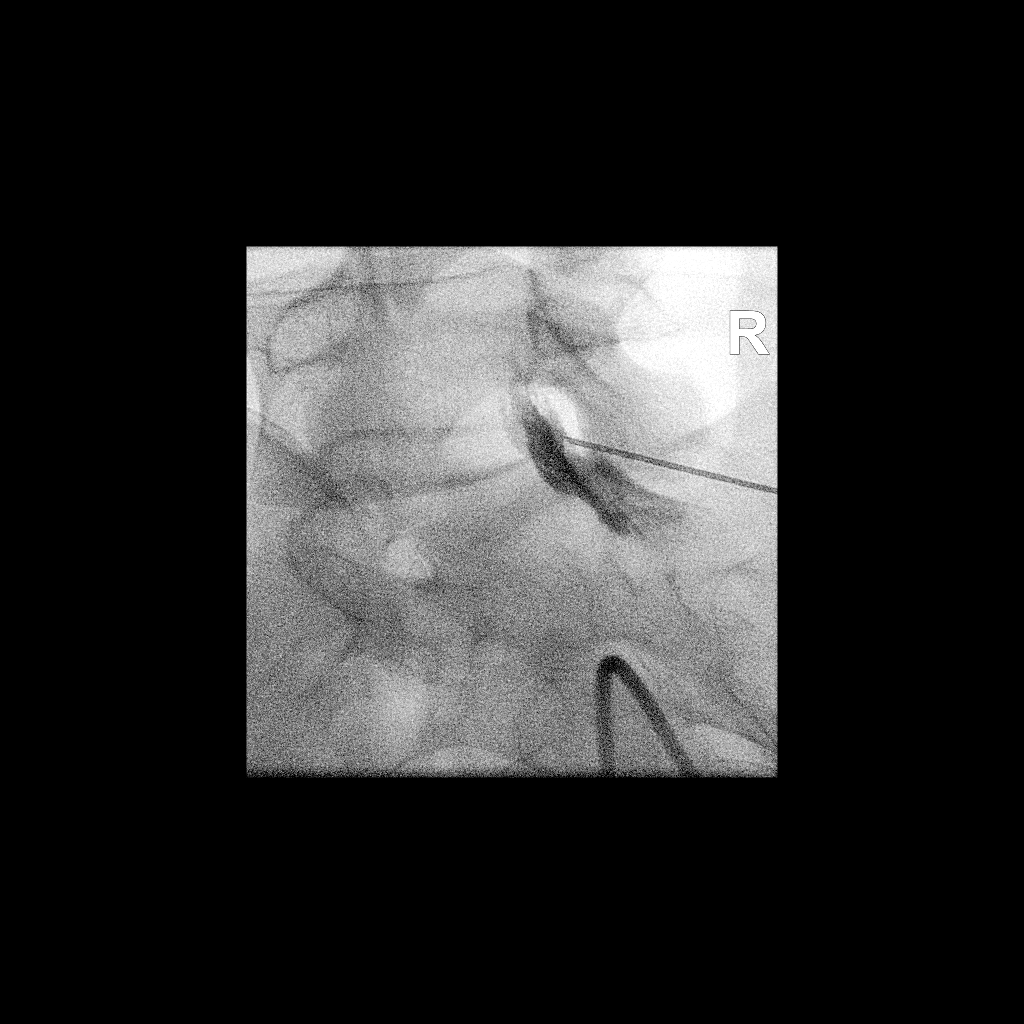

[Series 2: ortho standard · 1 of 1 slices shown (2 of 2)]
[im 1/1]
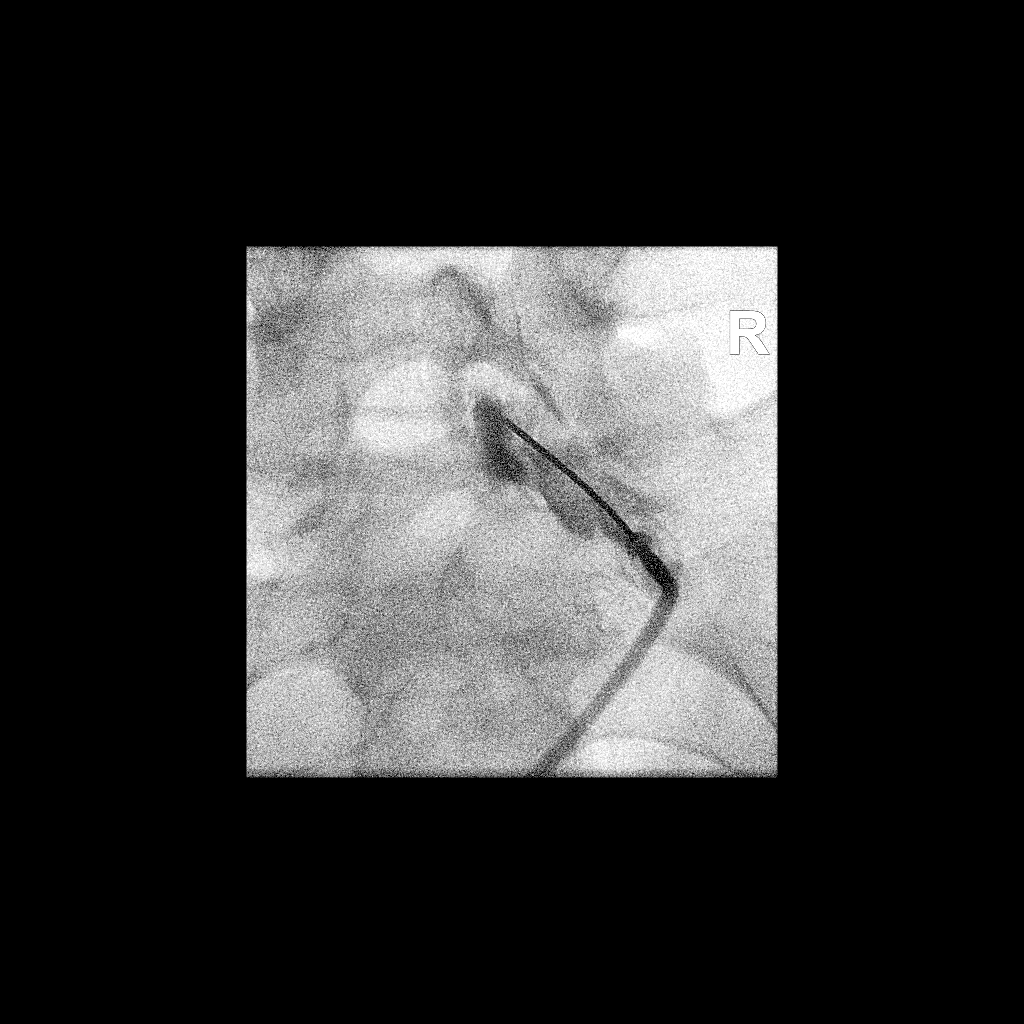

[2 of 2 positions shown; findings below may reference images not displayed]

FLUOROSCOPY:
Radiation Exposure Index (as provided by the fluoroscopic device): 0
minutes 9 seconds. 2.26 micro gray meter squared

PROCEDURE:
The procedure, risks, benefits, and alternatives were explained to
the patient. Questions regarding the procedure were encouraged and
answered. The patient understands and consents to the procedure.

LUMBAR EPIDURAL INJECTION:

An interlaminar approach was performed on the right at L5-S1. The
overlying skin was cleansed and anesthetized. A 20 gauge epidural
needle was advanced using loss-of-resistance technique.

DIAGNOSTIC EPIDURAL INJECTION:

Injection of Isovue-M 200 shows a good epidural pattern with spread
above and below the level of needle placement, primarily on the
right. No vascular opacification is seen.

THERAPEUTIC EPIDURAL INJECTION:

Eighty mg of Depo-Medrol mixed with 2 cc 1% lidocaine were
instilled. The procedure was well-tolerated, and the patient was
discharged thirty minutes following the injection in good condition.

COMPLICATIONS:
None
IMPRESSION: Technically successful epidural injection on the right L5-S1.

## 2022-08-26 ENCOUNTER — Telehealth: Payer: 59 | Admitting: Sports Medicine

## 2022-08-26 DIAGNOSIS — G8929 Other chronic pain: Secondary | ICD-10-CM

## 2022-08-26 DIAGNOSIS — M545 Low back pain, unspecified: Secondary | ICD-10-CM | POA: Diagnosis not present

## 2022-08-26 NOTE — Progress Notes (Signed)
Virtual Visit via WebEx/MyChart   I connected with  Julie Landry  on 08/26/22 via WebEx/MyChart/Doximity Video and verified that I am speaking with the correct person using two identifiers.   I discussed the limitations, risks, security and privacy concerns of performing an evaluation and management service by WebEx/MyChart/Doximity Video, including the higher likelihood of inaccurate diagnosis and treatment, and the availability of in person appointments.  We also discussed the likely need of an additional face to face encounter for complete and high quality delivery of care.  I also discussed with the patient that there may be a patient responsible charge related to this service. The patient expressed understanding and wishes to proceed.  Provider location is in medical facility. Patient location is at their home, different from provider location. People involved in care of the patient during this telehealth encounter were myself, my nurse/medical assistant, and my front office/scheduling team member.  Review of Systems: No fevers, chills, night sweats, weight loss, chest pain, or shortness of breath.   Objective Findings:    General: Speaking full sentences, no audible heavy breathing.  Sounds alert and appropriately interactive.  Appears well.  Face symmetric.  Extraocular movements intact.  Pupils equal and round.  No nasal flaring or accessory muscle use visualized.  Independent interpretation of tests performed by another provider:   None.  Brief History, Exam, Impression, and Recommendations:    Chronic right-sided low back pain without sciatica, potentially SI joint dysfunction versus piriformis syndrome Previous history:  Julie Landry returns, she is a pleasant 31 year old female, chronic axial right-sided low back pain, buttock pain, she had epidurals, SI joint injection, neurosurgery consultation, physical therapy, Cymbalta, Flexeril. Overall continues to improve. All of the  above injections have provided her some relief. Neurosurgery did suggest lumbar fusion which we have decided was not a good idea for her at this juncture. Her bilateral hip abductor strength is good, she has no areas of tenderness to palpation, she does get occasional soreness after intense physical activity but we discussed how this was normal for someone her age. We will go ahead and put her back on restricted duty for 3 weeks followed by full duty, but she would need an appointment with me before returning to full duty.  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  She still has some discomfort, but things are doing a lot better.  She still has some pain in the right gluteal region, and plans to get more consistent with her spinal extension conditioning.  Cymbalta dosing is good, mood is excellent.  She will also see if it is possible for her employer to get her a convertible/stand-up workstation, I am happy to write a letter if needed.  I have also suggested things like a yoga ball for seating as this can help continue to fire her core musculature while sitting.  We wrote a new work restrictions note today.  She is happy with how things are going so I think we can just follow this up in 6 months with an annual physical.   I discussed the above assessment and treatment plan with the patient. The patient was provided an opportunity to ask questions and all were answered. The patient agreed with the plan and demonstrated an understanding of the instructions.   The patient was advised to call back or seek an in-person evaluation if the symptoms worsen or if the  condition fails to improve as anticipated.   I provided 30 minutes of face to face and non-face-to-face time during this encounter date, time was needed to gather information, review chart, records, communicate/coordinate with staff  remotely, as well as complete documentation.   ____________________________________________ Ihor Austin. Benjamin Stain, M.D., ABFM., CAQSM., AME. Primary Care and Sports Medicine [place] [facility]  Adjunct Professor of Family Medicine  [place] of [facility] of [employer]  Restaurant manager, fast food

## 2022-08-26 NOTE — Assessment & Plan Note (Addendum)
Previous history:  Julie Landry returns, she is a pleasant 31 year old female, chronic axial right-sided low back pain, buttock pain, she had epidurals, SI joint injection, neurosurgery consultation, physical therapy, Cymbalta, Flexeril. Overall continues to improve. All of the above injections have provided her some relief. Neurosurgery did suggest lumbar fusion which we have decided was not a good idea for her at this juncture. Her bilateral hip abductor strength is good, she has no areas of tenderness to palpation, she does get occasional soreness after intense physical activity but we discussed how this was normal for someone her age. We will go ahead and put her back on restricted duty for 3 weeks followed by full duty, but she would need an appointment with me before returning to full duty.  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  She still has some discomfort, but things are doing a lot better.  She still has some pain in the right gluteal region, and plans to get more consistent with her spinal extension conditioning.  Cymbalta dosing is good, mood is excellent.  She will also see if it is possible for her employer to get her a convertible/stand-up workstation, I am happy to write a letter if needed.  I have also suggested things like a yoga ball for seating as this can help continue to fire her core musculature while sitting.  We wrote a new work restrictions note today.  She is happy with how things are going so I think we can just follow this up in 6 months with an annual physical.

## 2022-10-04 ENCOUNTER — Ambulatory Visit: Payer: 59 | Admitting: Family Medicine

## 2022-10-04 ENCOUNTER — Encounter: Payer: Self-pay | Admitting: Family Medicine

## 2022-10-04 VITALS — BP 121/75 | HR 95 | Temp 97.6°F | Ht 66.0 in | Wt 177.0 lb

## 2022-10-04 DIAGNOSIS — J0110 Acute frontal sinusitis, unspecified: Secondary | ICD-10-CM

## 2022-10-04 MED ORDER — AZITHROMYCIN 500 MG PO TABS: 500.0000 mg | ORAL_TABLET | Freq: Every day | ORAL | 0 refills | Status: DC

## 2022-10-04 MED ORDER — PROMETHAZINE-DM 6.25-15 MG/5ML PO SYRP: 5.0000 mL | ORAL_SOLUTION | Freq: Four times a day (QID) | ORAL | 0 refills | Status: DC | PRN

## 2022-10-04 NOTE — Progress Notes (Signed)
Subjective:     Julie Landry is a 31 y.o. female who presents for evaluation of sinus pain. Symptoms include: clear rhinorrhea, congestion, cough, frequent clearing of the throat, itchy eyes, nasal congestion, and sinus pressure. Onset of symptoms was 3 days ago. Symptoms have been unchanged since that time. Past history is significant for no history of pneumonia or bronchitis. Patient is a non-smoker. Tried OTC medicines not helping.  The following portions of the patient's history were reviewed and updated as appropriate: allergies, current medications, and past medical history.  Review of Systems Pertinent items are noted in HPI.   Objective:    BP 121/75   Pulse 95   Temp 97.6 F (36.4 C)   Ht 5\' 6"  (1.676 m)   Wt 177 lb (80.3 kg)   SpO2 97%   BMI 28.57 kg/m  General appearance: alert, cooperative, appears stated age, and no distress Head: Normocephalic, without obvious abnormality, atraumatic Eyes: conjunctivae/corneas clear. PERRL, EOM's intact. Fundi benign. Ears: normal TM's and external ear canals both ears Nose: turbinates swollen Throat: lips, mucosa, and tongue normal; teeth and gums normal Lungs: clear to auscultation bilaterally and normal percussion bilaterally Heart: regular rate and rhythm, S1, S2 normal, no murmur, click, rub or gallop Pulses: 2+ and symmetric Skin: Skin color, texture, turgor normal. No rashes or lesions    Assessment:    Acute bacterial sinusitis.   Diagnoses and all orders for this visit: Acute non-recurrent frontal sinusitis -     azithromycin (ZITHROMAX) 500 MG tablet; Take 1 tablet (500 mg total) by mouth daily. -     promethazine-dextromethorphan (PROMETHAZINE-DM) 6.25-15 MG/5ML syrup; Take 5 mLs by mouth 4 (four) times daily as needed for cough.   Plan:    Nasal saline sprays. Nasal steroids per medication orders. Azithromycin per medication orders. Follow up in a few days or as needed.

## 2022-11-17 ENCOUNTER — Other Ambulatory Visit: Payer: Self-pay | Admitting: Sports Medicine

## 2022-11-17 DIAGNOSIS — R058 Other specified cough: Secondary | ICD-10-CM

## 2023-02-28 ENCOUNTER — Ambulatory Visit: Payer: 59 | Admitting: Sports Medicine

## 2023-03-02 ENCOUNTER — Ambulatory Visit: Payer: 59 | Admitting: Medical-Surgical

## 2023-03-02 ENCOUNTER — Ambulatory Visit (INDEPENDENT_AMBULATORY_CARE_PROVIDER_SITE_OTHER): Payer: 59

## 2023-03-02 ENCOUNTER — Encounter: Payer: Self-pay | Admitting: Medical-Surgical

## 2023-03-02 VITALS — BP 123/76 | HR 74 | Resp 20 | Ht 66.0 in | Wt 182.7 lb

## 2023-03-02 DIAGNOSIS — M5136 Other intervertebral disc degeneration, lumbar region with discogenic back pain only: Secondary | ICD-10-CM

## 2023-03-02 DIAGNOSIS — G8929 Other chronic pain: Secondary | ICD-10-CM | POA: Diagnosis not present

## 2023-03-02 DIAGNOSIS — M545 Low back pain, unspecified: Secondary | ICD-10-CM | POA: Diagnosis not present

## 2023-03-02 DIAGNOSIS — R1013 Epigastric pain: Secondary | ICD-10-CM

## 2023-03-02 MED ORDER — ONDANSETRON HCL 4 MG PO TABS: 4.0000 mg | ORAL_TABLET | Freq: Three times a day (TID) | ORAL | 0 refills | Status: AC | PRN

## 2023-03-02 MED ORDER — DULOXETINE HCL 30 MG PO CPEP: 30.0000 mg | ORAL_CAPSULE | Freq: Every day | ORAL | 1 refills | Status: DC

## 2023-03-02 MED ORDER — PANTOPRAZOLE SODIUM 40 MG PO TBEC: 40.0000 mg | DELAYED_RELEASE_TABLET | Freq: Every day | ORAL | 3 refills | Status: DC

## 2023-03-02 MED ORDER — CYCLOBENZAPRINE HCL 10 MG PO TABS: ORAL_TABLET | ORAL | 3 refills | Status: AC

## 2023-03-02 NOTE — Progress Notes (Signed)
        Established patient visit  History, exam, impression, and plan:  1. Epigastric abdominal pain Pleasant 32 year old female with reports of abdominal pain in the epigastric region and bilateral upper quadrants that started yesterday.  Pain is accompanied by bloating, poor appetite, and mild nausea.  No fever, chills, vomiting, diarrhea, sick contacts, or medication changes.  Did eat out at a restaurant on Saturday that was new for her.  Saturday night, developed a fever, nausea, and vomiting that resolved within 24 hours.  Has tried Midol and heat for symptoms with no benefit.  No known history of ulcers or GI bleeds.  Having regular bowel movements that are dark brown in color, no hematochezia or melena.  On exam, BS + x 4.  Abdomen soft, nondistended, mild epigastric tenderness.  Eating and drinking well.  Admits that she thinks she did this to herself by taking Aleve liquid gels every day over the last month to treat sinus headache.  Discussed differentials which include GERD, H. pylori, PUD, or medication side effect.  Checking labs as below.  H. pylori breath test today.  Adding Zofran 4 mg every 8 hours as needed for nausea.  After completing H. pylori breath test, start Protonix 40 mg daily.  Discontinue all NSAIDs and avoid alcohol.  Get KUB today. - pantoprazole (PROTONIX) 40 MG tablet; Take 1 tablet (40 mg total) by mouth daily.  Dispense: 30 tablet; Refill: 3 - H. pylori breath test - ondansetron (ZOFRAN) 4 MG tablet; Take 1 tablet (4 mg total) by mouth every 8 (eight) hours as needed for nausea or vomiting.  Dispense: 20 tablet; Refill: 0 - CBC with Differential/Platelet - COMPLETE METABOLIC PANEL WITH GFR - Amylase - Lipase  2. Low back and buttock pain, right 3. Chronic right-sided low back pain without sciatica, potentially SI joint dysfunction versus piriformis syndrome 4. Discogenic low back pain Has been monitored by her PCP for low back pain and SI joint dysfunction.  Has  been taking Cymbalta 30 mg daily and using Flexeril as needed which seems to be a very good combination for her.  Continues to stay physically active as tolerated and uses conservative measures for breakthrough discomfort.  Continue Cymbalta and Flexeril as prescribed.  Follow-up with PCP as instructed. - cyclobenzaprine (FLEXERIL) 10 MG tablet; One half to one tab PO qHS, then increase gradually to one tab TID.  Dispense: 90 tablet; Refill: 3 - DULoxetine (CYMBALTA) 30 MG capsule; Take 1 capsule (30 mg total) by mouth daily.  Dispense: 90 capsule; Refill: 1  Procedures performed this visit: None.  Return if symptoms worsen or fail to improve.  __________________________________ Thayer Ohm, DNP, APRN, FNP-BC Primary Care and Sports Medicine [facility]

## 2023-03-03 ENCOUNTER — Encounter: Payer: Self-pay | Admitting: Medical-Surgical

## 2023-03-03 ENCOUNTER — Ambulatory Visit: Payer: 59 | Admitting: Sports Medicine

## 2023-03-07 LAB — CBC WITH DIFFERENTIAL/PLATELET
Absolute Monocytes: 631 cells/uL (ref 200–950)
Basophils Absolute: 78 cells/uL (ref 0–200)
Basophils Relative: 0.8 %
Eosinophils Absolute: 592 cells/uL — ABNORMAL HIGH (ref 15–500)
Eosinophils Relative: 6.1 %
HCT: 41.9 % (ref 35.0–45.0)
Hemoglobin: 13.6 g/dL (ref 11.7–15.5)
Lymphs Abs: 3240 cells/uL (ref 850–3900)
MCH: 28.6 pg (ref 27.0–33.0)
MCHC: 32.5 g/dL (ref 32.0–36.0)
MCV: 88 fL (ref 80.0–100.0)
MPV: 9.9 fL (ref 7.5–12.5)
Monocytes Relative: 6.5 %
Neutro Abs: 5160 cells/uL (ref 1500–7800)
Neutrophils Relative %: 53.2 %
Platelets: 415 10*3/uL — ABNORMAL HIGH (ref 140–400)
RBC: 4.76 10*6/uL (ref 3.80–5.10)
RDW: 12.1 % (ref 11.0–15.0)
Total Lymphocyte: 33.4 %
WBC: 9.7 10*3/uL (ref 3.8–10.8)

## 2023-03-07 LAB — COMPLETE METABOLIC PANEL WITH GFR
AG Ratio: 1.4 (calc) (ref 1.0–2.5)
ALT: 14 U/L (ref 6–29)
AST: 16 U/L (ref 10–30)
Albumin: 4.1 g/dL (ref 3.6–5.1)
Alkaline phosphatase (APISO): 51 U/L (ref 31–125)
BUN: 8 mg/dL (ref 7–25)
CO2: 26 mmol/L (ref 20–32)
Calcium: 9.4 mg/dL (ref 8.6–10.2)
Chloride: 107 mmol/L (ref 98–110)
Creat: 0.87 mg/dL (ref 0.50–0.97)
Globulin: 2.9 g/dL (calc) (ref 1.9–3.7)
Glucose, Bld: 89 mg/dL (ref 65–99)
Potassium: 4.4 mmol/L (ref 3.5–5.3)
Sodium: 140 mmol/L (ref 135–146)
Total Bilirubin: 0.2 mg/dL (ref 0.2–1.2)
Total Protein: 7 g/dL (ref 6.1–8.1)
eGFR: 91 mL/min/{1.73_m2} (ref >=60)

## 2023-03-07 LAB — H. PYLORI BREATH TEST: H. pylori Breath Test: NOT DETECTED

## 2023-03-07 LAB — AMYLASE: Amylase: 41 U/L (ref 21–101)

## 2023-03-07 LAB — LIPASE: Lipase: 14 U/L (ref 7–60)

## 2023-05-28 ENCOUNTER — Other Ambulatory Visit: Payer: Self-pay | Admitting: Medical-Surgical

## 2023-05-28 DIAGNOSIS — R1013 Epigastric pain: Secondary | ICD-10-CM

## 2023-10-15 ENCOUNTER — Ambulatory Visit
Admission: EM | Admit: 2023-10-15 | Discharge: 2023-10-15 | Disposition: A | Discharge status: Home / Self Care | Payer: 59 | Attending: Family Medicine | Admitting: Family Medicine

## 2023-10-15 ENCOUNTER — Other Ambulatory Visit: Payer: Self-pay

## 2023-10-15 ENCOUNTER — Telehealth: Payer: 59 | Admitting: Physician Assistant

## 2023-10-15 DIAGNOSIS — J029 Acute pharyngitis, unspecified: Secondary | ICD-10-CM

## 2023-10-15 DIAGNOSIS — K122 Cellulitis and abscess of mouth: Secondary | ICD-10-CM

## 2023-10-15 LAB — POCT RAPID STREP A (OFFICE): Rapid Strep A Screen: NEGATIVE

## 2023-10-15 MED ORDER — AMOXICILLIN-POT CLAVULANATE 875-125 MG PO TABS: 1.0000 | ORAL_TABLET | Freq: Two times a day (BID) | ORAL | 0 refills | Status: DC

## 2023-10-15 MED ORDER — PREDNISONE 20 MG PO TABS: ORAL_TABLET | ORAL | 0 refills | Status: DC

## 2023-10-15 NOTE — ED Provider Notes (Signed)
[name]    CSN: [id number] Arrival date & time: 10/15/23  1027      History   Chief Complaint Chief Complaint  Patient presents with   Sore Throat    HPI Julie Landry is a 32 y.o. female.   HPI 32 year old female presents with sore throat with white patches and is concerned for strep since last night.  PMH significant for obesity and chronic right-sided low back pain without sciatica.  History reviewed. No pertinent past medical history.  Patient Active Problem List   Diagnosis Date Noted   Prolapsed cervical intervertebral disc 10/05/2021   Chronic right-sided low back pain without sciatica, potentially SI joint dysfunction versus piriformis syndrome 09/25/2021   Papanicolaou smear of cervix with low grade squamous intraepithelial lesion (LGSIL) 07/25/2013    Past Surgical History:  Procedure Laterality Date   ELBOW FRACTURE SURGERY     ROTATOR CUFF REPAIR      OB History   No obstetric history on file.      Home Medications    Prior to Admission medications   Medication Sig Start Date End Date Taking? Authorizing Provider  amoxicillin-clavulanate (AUGMENTIN) 875-125 MG tablet Take 1 tablet by mouth every 12 (twelve) hours. 10/15/23  Yes Trevor Iha, FNP  predniSONE (DELTASONE) 20 MG tablet Take 3 tabs PO daily x 5 days. 10/15/23  Yes Trevor Iha, FNP  cyclobenzaprine (FLEXERIL) 10 MG tablet One half to one tab PO qHS, then increase gradually to one tab TID. 03/02/23   Christen Butter, NP  DULoxetine (CYMBALTA) 30 MG capsule Take 1 capsule (30 mg total) by mouth daily. 03/02/23   Christen Butter, NP  levonorgestrel-ethinyl estradiol (SEASONALE) 0.15-0.03 MG tablet Take 1 tablet by mouth daily.    [provider]  ondansetron (ZOFRAN) 4 MG tablet Take 1 tablet (4 mg total) by mouth every 8 (eight) hours as needed for nausea or vomiting. 03/02/23   Christen Butter, NP  pantoprazole (PROTONIX) 40 MG tablet TAKE 1 TABLET BY MOUTH EVERY DAY 05/30/23    Christen Butter, NP    Family History Family History  Problem Relation Age of Onset   Heart attack Mother    Diabetes Mother    Parkinson's disease Father    Hyperlipidemia Father    Depression Father     Social History Social History   Tobacco Use   Smoking status: Former    Current packs/day: 1.00    Average packs/day: 1 pack/day for 8.0 years (8.0 ttl pk-yrs)    Types: Cigarettes   Smokeless tobacco: Never  Vaping Use   Vaping status: Every Day   Substances: Nicotine, Flavoring  Substance Use Topics   Alcohol use: Yes   Drug use: Never     Allergies   Sulfa antibiotics   Review of Systems Review of Systems  HENT:  Positive for sore throat.   All other systems reviewed and are negative.    Physical Exam Triage Vital Signs ED Triage Vitals  Encounter Vitals Group     BP 10/15/23 1108 107/71     Systolic BP Percentile --      Diastolic BP Percentile --      Pulse Rate 10/15/23 1108 76     Resp 10/15/23 1108 16     Temp 10/15/23 1108 98 F (36.7 C)     Temp src --      SpO2 10/15/23 1108 98 %     Weight --      Height --  Head Circumference --      Peak Flow --      Pain Score 10/15/23 1107 6     Pain Loc --      Pain Education --      Exclude from Growth Chart --    No data found.  Updated Vital Signs BP 107/71   Pulse 76   Temp 98 F (36.7 C)   Resp 16   LMP 09/24/2023   SpO2 98%    Physical Exam Vitals and nursing note reviewed.  Constitutional:      Appearance: She is well-developed. She is obese.  HENT:     Head: Normocephalic and atraumatic.     Right Ear: Tympanic membrane and ear canal normal.     Left Ear: Tympanic membrane and ear canal normal.     Mouth/Throat:     Mouth: Mucous membranes are moist.     Pharynx: Oropharynx is clear. Uvula midline. Posterior oropharyngeal erythema and uvula swelling present.     Tonsils: Tonsillar exudate present. 2+ on the right. 2+ on the left.  Eyes:     Conjunctiva/sclera:  Conjunctivae normal.     Pupils: Pupils are equal, round, and reactive to light.  Cardiovascular:     Rate and Rhythm: Normal rate and regular rhythm.     Heart sounds: Normal heart sounds.  Pulmonary:     Effort: Pulmonary effort is normal.     Breath sounds: Normal breath sounds. No wheezing, rhonchi or rales.  Musculoskeletal:     Cervical back: Normal range of motion and neck supple.  Skin:    General: Skin is warm and dry.  Neurological:     General: No focal deficit present.     Mental Status: She is alert and oriented to person, place, and time.      UC Treatments / Results  Labs (all labs ordered are listed, but only abnormal results are displayed) Labs Reviewed  POCT RAPID STREP A (OFFICE)    EKG   Radiology No results found.  Procedures Procedures (including critical care time)  Medications Ordered in UC Medications - No data to display  Initial Impression / Assessment and Plan / UC Course  I have reviewed the triage vital signs and the nursing notes.  Pertinent labs & imaging results that were available during my care of the patient were reviewed by me and considered in my medical decision making (see chart for details).     MDM: 1.  Uvulitis-Rx'd Augmentin 875/125 mg tablet: Take 1 tablet twice daily x 7 days; 2.  Sore throat-Rx'd prednisone 20 mg tablet: Take 1 3 tabs p.o. daily x 5 days. Advised patient to take medications as directed with food to completion.  Advised patient to take prednisone with first dose of Augmentin for the next 5 of 7 days.  Encouraged to increase daily water intake to 64 ounces per day while taking these medications.  Advised if symptoms worsen and/or unresolved please follow-up PCP or here for further evaluation.  Patient discharged home, hemodynamically stable. Final Clinical Impressions(s) / UC Diagnoses   Final diagnoses:  Uvulitis  Sore throat     Discharge Instructions      Advised patient to take medications as  directed with food to completion.  Advised patient to take prednisone with first dose of Augmentin for the next 5 of 7 days.  Encouraged to increase daily water intake to 64 ounces per day while taking these medications.  Advised if symptoms worsen  and/or unresolved please follow-up PCP or here for further evaluation.     ED Prescriptions     Medication Sig Dispense Auth. Provider   amoxicillin-clavulanate (AUGMENTIN) 875-125 MG tablet Take 1 tablet by mouth every 12 (twelve) hours. 14 tablet Trevor Iha, FNP   predniSONE (DELTASONE) 20 MG tablet Take 3 tabs PO daily x 5 days. 15 tablet Trevor Iha, FNP      PDMP not reviewed this encounter.   Trevor Iha, FNP 10/15/23 1219

## 2023-10-15 NOTE — Discharge Instructions (Addendum)
Advised patient to take medications as directed with food to completion.  Advised patient to take prednisone with first dose of Augmentin for the next 5 of 7 days.  Encouraged to increase daily water intake to 64 ounces per day while taking these medications.  Advised if symptoms worsen and/or unresolved please follow-up PCP or here for further evaluation.

## 2023-10-15 NOTE — Progress Notes (Signed)
Because of your symptoms, I feel your condition warrants further evaluation and I recommend that you be seen in a face to face visit.   NOTE: There will be NO CHARGE for this eVisit   If you are having a true medical emergency please call 911.      For an urgent face to face visit, [facility] has eight urgent care centers for your convenience:   NEW!! [facility] Ur[facility] Center at [facility] Get Driving Directions [phone] [address], [address] [place], [address]    [facility] Health Urgent Care Center at [facility] Get Driving Directions [phone] [address] [place], [place] [address]   [facility] Health Urgent Care Center [facility]) Get Driving Directions [phone] [address] [place], [place] [address]  [facility] Health Urgent Care Center [facility]) Get Driving Directions [phone] [address] [place],  [place]  [address]  [facility] Health Urgent Care Center [facility] - at [employer]  [phone] [address] [place],  [place] [address]   [facility] Health Urgent Care at [facility] Get Driving Directions [phone] [address], [address] [place], [place] [address]   [facility] Health Urgent Care at [facility] Get Driving Directions  [phone] [address] [place], [place] [address]   [facility] Health Urgent Care at [facility] Directions [phone] [address], [address] [place], [place] [address]  Your MyChart E-visit questionnaire answers were reviewed by a board certified advanced clinical practitioner to complete your personal care plan based on your specific symptoms.  Thank you for using e-Visits.

## 2023-10-15 NOTE — ED Triage Notes (Signed)
Pt presents to uc with co of sore throat since last night with white patches. Pt reports concern for strep.

## 2023-11-02 ENCOUNTER — Ambulatory Visit (INDEPENDENT_AMBULATORY_CARE_PROVIDER_SITE_OTHER): Payer: 59 | Admitting: Family Medicine

## 2023-11-02 ENCOUNTER — Encounter: Payer: Self-pay | Admitting: Family Medicine

## 2023-11-02 VITALS — BP 122/73 | HR 88 | Temp 97.8°F | Resp 18 | Ht 66.0 in | Wt 199.2 lb

## 2023-11-02 DIAGNOSIS — H6691 Otitis media, unspecified, right ear: Secondary | ICD-10-CM | POA: Diagnosis not present

## 2023-11-02 MED ORDER — CEFDINIR 300 MG PO CAPS: 300.0000 mg | ORAL_CAPSULE | Freq: Two times a day (BID) | ORAL | 0 refills | Status: AC

## 2023-11-02 MED ORDER — CIPRO HC 0.2-1 % OT SUSP: 3.0000 [drp] | Freq: Two times a day (BID) | OTIC | 0 refills | Status: DC

## 2023-11-02 NOTE — Progress Notes (Signed)
   Acute Office Visit  Subjective:     Patient ID: Julie Landry, female    DOB: 02-Jun-1991, 33 y.o.   MRN: [id number]  No chief complaint on file.   HPI Patient is in today for acute visit.  Pt went to urgent care on 12/28 for white patches on her throat and swollen uvula. She was given Prednisone  and Augmentin . She took it differently and took 1 prednisone  and 1 Augmentin  until gone. She reports she completed everything last weekend. She has mucus in her throat with right jaw pain along with right ear. She also has some dry cough along with head pressure on the right. Denies nasal congestion.  Review of Systems  HENT:  Positive for ear pain and sore throat.        Right facial pain  Respiratory:  Positive for cough.   All other systems reviewed and are negative.       Objective:    LMP 09/24/2023  BP Readings from Last 3 Encounters:  11/02/23 122/73  10/15/23 107/71  03/02/23 123/76      Physical Exam Vitals and nursing note reviewed.  Constitutional:      Appearance: Normal appearance. She is normal weight.  HENT:     Head: Normocephalic and atraumatic.     Right Ear: External ear normal.     Left Ear: Tympanic membrane, ear canal and external ear normal.     Ears:     Comments: Right bulging TM with effusion    Nose: Nose normal.     Mouth/Throat:     Mouth: Mucous membranes are moist.     Pharynx: Oropharynx is clear.  Eyes:     Conjunctiva/sclera: Conjunctivae normal.     Pupils: Pupils are equal, round, and reactive to light.  Cardiovascular:     Rate and Rhythm: Normal rate and regular rhythm.     Pulses: Normal pulses.     Heart sounds: Normal heart sounds.  Pulmonary:     Effort: Pulmonary effort is normal.     Breath sounds: Normal breath sounds.  Skin:    General: Skin is warm.     Capillary Refill: Capillary refill takes less than 2 seconds.  Neurological:     General: No focal deficit present.     Mental Status: She is alert and oriented to  person, place, and time. Mental status is at baseline.  Psychiatric:        Mood and Affect: Mood normal.        Behavior: Behavior normal.        Thought Content: Thought content normal.        Judgment: Judgment normal.     No results found for any visits on 11/02/23.      Assessment & Plan:   Problem List Items Addressed This Visit   None OM (otitis media), recurrent, right -     Cefdinir ; Take 1 capsule (300 mg total) by mouth 2 (two) times daily for 10 days.  Dispense: 20 capsule; Refill: 0 -     Cipro  HC; Place 3 drops into the right ear 2 (two) times daily.  Dispense: 10 mL; Refill: 0   Symptoms and physical exam consistent with OM. Treat with Omnicef  300mg  Bid and Cipro  dex.   No orders of the defined types were placed in this encounter.   No follow-ups on file.  Manette Section, MD

## 2023-11-22 ENCOUNTER — Other Ambulatory Visit: Payer: Self-pay | Admitting: Medical-Surgical

## 2023-11-22 DIAGNOSIS — R1013 Epigastric pain: Secondary | ICD-10-CM

## 2023-11-26 ENCOUNTER — Other Ambulatory Visit: Payer: Self-pay | Admitting: Medical-Surgical

## 2023-11-26 DIAGNOSIS — M5136 Other intervertebral disc degeneration, lumbar region with discogenic back pain only: Secondary | ICD-10-CM

## 2023-11-28 ENCOUNTER — Other Ambulatory Visit: Payer: Self-pay | Admitting: Sports Medicine

## 2023-11-28 DIAGNOSIS — M5136 Other intervertebral disc degeneration, lumbar region with discogenic back pain only: Secondary | ICD-10-CM

## 2023-12-06 ENCOUNTER — Encounter: Payer: Self-pay | Admitting: Sports Medicine

## 2023-12-06 ENCOUNTER — Ambulatory Visit (INDEPENDENT_AMBULATORY_CARE_PROVIDER_SITE_OTHER): Payer: No Typology Code available for payment source | Admitting: Sports Medicine

## 2023-12-06 VITALS — BP 121/97 | HR 112 | Temp 97.8°F | Ht 66.0 in | Wt 197.0 lb

## 2023-12-06 DIAGNOSIS — J101 Influenza due to other identified influenza virus with other respiratory manifestations: Secondary | ICD-10-CM | POA: Diagnosis not present

## 2023-12-06 DIAGNOSIS — E66811 Obesity, class 1: Secondary | ICD-10-CM | POA: Diagnosis not present

## 2023-12-06 DIAGNOSIS — E669 Obesity, unspecified: Secondary | ICD-10-CM | POA: Insufficient documentation

## 2023-12-06 DIAGNOSIS — R6889 Other general symptoms and signs: Secondary | ICD-10-CM | POA: Diagnosis not present

## 2023-12-06 DIAGNOSIS — M5136 Other intervertebral disc degeneration, lumbar region with discogenic back pain only: Secondary | ICD-10-CM

## 2023-12-06 DIAGNOSIS — Z6831 Body mass index (BMI) 31.0-31.9, adult: Secondary | ICD-10-CM

## 2023-12-06 LAB — POCT INFLUENZA A/B
Influenza A, POC: POSITIVE — AB
Influenza B, POC: NEGATIVE

## 2023-12-06 LAB — POC COVID19 BINAXNOW: SARS Coronavirus 2 Ag: NEGATIVE

## 2023-12-06 MED ORDER — DULOXETINE HCL 30 MG PO CPEP: 30.0000 mg | ORAL_CAPSULE | Freq: Every day | ORAL | 3 refills | Status: AC

## 2023-12-06 MED ORDER — PREDNISONE 50 MG PO TABS: ORAL_TABLET | ORAL | 0 refills | Status: AC

## 2023-12-06 MED ORDER — OSELTAMIVIR PHOSPHATE 75 MG PO CAPS: 75.0000 mg | ORAL_CAPSULE | Freq: Two times a day (BID) | ORAL | 0 refills | Status: AC

## 2023-12-06 MED ORDER — ZEPBOUND 2.5 MG/0.5ML ~~LOC~~ SOAJ: 2.5000 mg | SUBCUTANEOUS | 0 refills | Status: AC

## 2023-12-06 NOTE — Progress Notes (Signed)
    Procedures performed today:    None.  Independent interpretation of notes and tests performed by another provider:   None.  Brief History, Exam, Impression, and Recommendations:    Influenza A Very pleasant 33 year old female, 2 days of muscle aches, body aches, fevers and chills, headache. Coughing and sore throat. Her exam is normal with the exception of some visible postnasal drip, lungs are clear, no respiratory distress. Influenza A test positive, COVID-negative, flu B neg. Adding Tamiflu, prednisone. She does have a cruise coming up at the end of the week.  Obesity Weight is in the obese range now. Julie Landry also has some back pain, she has obesity with a major comorbidity. She will be enrolled in a multidisciplinary weight loss program with calorie counting, and exercise prescription. She has no contraindications to the use of a GLP-1. She will not be on any other weight loss medications. We will bring her back in a month to reevaluate tolerance and weight loss. We will start with Zepbound, if unable to get this approved we will try compounded tirzepatide.    ____________________________________________ Ihor Austin. Benjamin Stain, M.D., ABFM., CAQSM., AME. Primary Care and Sports Medicine [facility]  Adjunct Professor of Family Medicine  [place] [employer]  Restaurant manager, fast food

## 2023-12-06 NOTE — Assessment & Plan Note (Signed)
Very pleasant 33 year old female, 2 days of muscle aches, body aches, fevers and chills, headache. Coughing and sore throat. Her exam is normal with the exception of some visible postnasal drip, lungs are clear, no respiratory distress. Influenza A test positive, COVID-negative, flu B neg. Adding Tamiflu, prednisone. She does have a cruise coming up at the end of the week.

## 2023-12-06 NOTE — Assessment & Plan Note (Signed)
Weight is in the obese range now. [name] also has some back pain, she has obesity with a major comorbidity. She will be enrolled in a multidisciplinary weight loss program with calorie counting, and exercise prescription. She has no contraindications to the use of a GLP-1. She will not be on any other weight loss medications. We will bring her back in a month to reevaluate tolerance and weight loss. We will start with Zepbound, if unable to get this approved we will try compounded tirzepatide.

## 2024-06-19 ENCOUNTER — Encounter: Payer: Self-pay | Admitting: Sports Medicine
